# Patient Record
Sex: Female | Born: 1985 | Race: White | Hispanic: No | Marital: Married | Smoking: Never smoker
Health system: Southern US, Community
[De-identification: ages and names within clinical notes are randomized; demographics above are authoritative.]

## PROBLEM LIST (undated history)

## (undated) DIAGNOSIS — E282 Polycystic ovarian syndrome: Secondary | ICD-10-CM

## (undated) DIAGNOSIS — O24419 Gestational diabetes mellitus in pregnancy, unspecified control: Secondary | ICD-10-CM

## (undated) DIAGNOSIS — F419 Anxiety disorder, unspecified: Secondary | ICD-10-CM

## (undated) DIAGNOSIS — Z789 Other specified health status: Secondary | ICD-10-CM

## (undated) HISTORY — DX: Anxiety disorder, unspecified: F41.9

## (undated) HISTORY — PX: WISDOM TOOTH EXTRACTION: SHX21

## (undated) HISTORY — DX: Polycystic ovarian syndrome: E28.2

---

## 2017-12-21 ENCOUNTER — Ambulatory Visit: Payer: Self-pay | Admitting: Nurse Practitioner

## 2017-12-21 VITALS — BP 115/80 | HR 118 | Temp 101.9°F | Resp 16 | Wt 131.6 lb

## 2017-12-21 DIAGNOSIS — R6889 Other general symptoms and signs: Secondary | ICD-10-CM

## 2017-12-21 LAB — POCT INFLUENZA A/B
INFLUENZA B, POC: NEGATIVE
Influenza A, POC: NEGATIVE

## 2017-12-21 MED ORDER — OSELTAMIVIR PHOSPHATE 75 MG PO CAPS: 75.0000 mg | ORAL_CAPSULE | Freq: Two times a day (BID) | ORAL | 0 refills | Status: AC

## 2017-12-21 NOTE — Progress Notes (Signed)
Subjective:  Hannah Coffey is a 32 y.o. female who presents for evaluation of influenza like symptoms.  Symptoms include achiness, cough described as nonproductive, fever: fevers up to 101 degrees and sore throat.  Onset of symptoms was today and has been gradually worsening since that time.  Treatment to date:  none.  High risk factors for influenza complications:  patient states her sister had influenza about 2 weeks ago, patient has not had a flu shot this year..  The following portions of the patient's history were reviewed and updated as appropriate:  allergies, current medications and past medical history.  Constitutional: positive for chills, fatigue, fevers and malaise, negative for anorexia, night sweats and weight loss Eyes: negative Ears, nose, mouth, throat, and face: positive for sore throat, negative for ear drainage, earaches and hoarseness Respiratory: positive for cough Cardiovascular: negative Gastrointestinal: negative Neurological: positive for headaches Behavioral/Psych: negative Allergic/Immunologic: negative Objective:  BP 115/80 (BP Location: Right Arm, Patient Position: Sitting, Cuff Size: Normal)   Pulse (!) 118   Temp (!) 101.9 F (38.8 C) (Oral)   Resp 16   Wt 131 lb 9.6 oz (59.7 kg)   SpO2 99%  General appearance: alert, cooperative, fatigued, flushed and no distress Head: Normocephalic, without obvious abnormality, atraumatic Eyes: conjunctivae/corneas clear. PERRL, EOM's intact. Fundi benign. Ears: normal TM's and external ear canals both ears Nose: clear discharge, turbinates swollen, inflamed, mild maxillary sinus tenderness bilateral, no frontal sinus tenderness bilateral Throat: abnormal findings: mild oropharyngeal erythema and no exudate Lungs: clear to auscultation bilaterally Heart: regular rate and rhythm, S1, S2 normal, no murmur, click, rub or gallop Abdomen: soft, non-tender; bowel sounds normal; no masses,  no organomegaly Pulses: 2+ and  symmetric Skin: Skin color, texture, turgor normal. No rashes or lesions Lymph nodes: cervical lymphadenopathy Neurologic: Grossly normal    Assessment:  influenza like syndrome    Plan:  Antivirals per ordrers. Discussed diagnosis and treatment of influenza. Educational material distributed and questions answered. Suggested symptomatic OTC remedies. Supportive care with appropriate antipyretics and fluids. Follow up as needed.

## 2017-12-21 NOTE — Patient Instructions (Addendum)

## 2019-09-14 LAB — OB RESULTS CONSOLE HEPATITIS B SURFACE ANTIGEN
Hepatitis B Surface Ag: NEGATIVE
Hepatitis B Surface Ag: NEGATIVE

## 2019-09-14 LAB — OB RESULTS CONSOLE RUBELLA ANTIBODY, IGM
Rubella: IMMUNE
Rubella: IMMUNE

## 2019-09-14 LAB — OB RESULTS CONSOLE GC/CHLAMYDIA
Chlamydia: NEGATIVE
Gonorrhea: NEGATIVE

## 2019-09-14 LAB — OB RESULTS CONSOLE ABO/RH: RH Type: POSITIVE

## 2019-09-14 LAB — OB RESULTS CONSOLE ANTIBODY SCREEN: Antibody Screen: NEGATIVE

## 2019-09-14 LAB — OB RESULTS CONSOLE HIV ANTIBODY (ROUTINE TESTING)
HIV: NONREACTIVE
HIV: NONREACTIVE

## 2019-09-14 LAB — OB RESULTS CONSOLE RPR
RPR: NONREACTIVE
RPR: NONREACTIVE

## 2020-01-24 ENCOUNTER — Emergency Department (HOSPITAL_COMMUNITY): Payer: BC Managed Care – PPO

## 2020-01-24 ENCOUNTER — Encounter (HOSPITAL_COMMUNITY): Payer: Self-pay | Admitting: *Deleted

## 2020-01-24 ENCOUNTER — Other Ambulatory Visit: Payer: Self-pay

## 2020-01-24 ENCOUNTER — Observation Stay (HOSPITAL_COMMUNITY)
Admission: EM | Admit: 2020-01-24 | Discharge: 2020-01-25 | Disposition: A | Discharge status: Home / Self Care | Payer: BC Managed Care – PPO | Attending: Obstetrics and Gynecology | Admitting: Obstetrics and Gynecology

## 2020-01-24 DIAGNOSIS — M7989 Other specified soft tissue disorders: Secondary | ICD-10-CM | POA: Insufficient documentation

## 2020-01-24 DIAGNOSIS — S30811A Abrasion of abdominal wall, initial encounter: Secondary | ICD-10-CM | POA: Diagnosis not present

## 2020-01-24 DIAGNOSIS — Z3A31 31 weeks gestation of pregnancy: Secondary | ICD-10-CM

## 2020-01-24 DIAGNOSIS — S8991XA Unspecified injury of right lower leg, initial encounter: Secondary | ICD-10-CM

## 2020-01-24 DIAGNOSIS — Z20822 Contact with and (suspected) exposure to covid-19: Secondary | ICD-10-CM | POA: Insufficient documentation

## 2020-01-24 DIAGNOSIS — Y93K1 Activity, walking an animal: Secondary | ICD-10-CM | POA: Insufficient documentation

## 2020-01-24 DIAGNOSIS — O9A213 Injury, poisoning and certain other consequences of external causes complicating pregnancy, third trimester: Principal | ICD-10-CM

## 2020-01-24 DIAGNOSIS — T148XXA Other injury of unspecified body region, initial encounter: Secondary | ICD-10-CM

## 2020-01-24 DIAGNOSIS — W19XXXA Unspecified fall, initial encounter: Secondary | ICD-10-CM | POA: Diagnosis not present

## 2020-01-24 DIAGNOSIS — S80211A Abrasion, right knee, initial encounter: Secondary | ICD-10-CM | POA: Diagnosis not present

## 2020-01-24 DIAGNOSIS — Z79899 Other long term (current) drug therapy: Secondary | ICD-10-CM | POA: Insufficient documentation

## 2020-01-24 DIAGNOSIS — W101XXA Fall (on)(from) sidewalk curb, initial encounter: Secondary | ICD-10-CM | POA: Insufficient documentation

## 2020-01-24 HISTORY — DX: Other specified health status: Z78.9

## 2020-01-24 LAB — TYPE AND SCREEN
ABO/RH(D): O POS
Antibody Screen: NEGATIVE

## 2020-01-24 LAB — CBC
HCT: 36.4 % (ref 36.0–46.0)
Hemoglobin: 12.1 g/dL (ref 12.0–15.0)
MCH: 31.3 pg (ref 26.0–34.0)
MCHC: 33.2 g/dL (ref 30.0–36.0)
MCV: 94.3 fL (ref 80.0–100.0)
Platelets: 171 10*3/uL (ref 150–400)
RBC: 3.86 MIL/uL — ABNORMAL LOW (ref 3.87–5.11)
RDW: 13.1 % (ref 11.5–15.5)
WBC: 12.6 10*3/uL — ABNORMAL HIGH (ref 4.0–10.5)
nRBC: 0 % (ref 0.0–0.2)

## 2020-01-24 LAB — ABO/RH: ABO/RH(D): O POS

## 2020-01-24 MED ORDER — ZOLPIDEM TARTRATE 5 MG PO TABS: 5.0000 mg | ORAL_TABLET | Freq: Every evening | ORAL | Status: DC | PRN

## 2020-01-24 MED ORDER — ACETAMINOPHEN 325 MG PO TABS
650.0000 mg | ORAL_TABLET | ORAL | Status: DC | PRN
  Administered 2020-01-24 – 2020-01-25 (×3): 650 mg via ORAL
  Filled 2020-01-24 (×3): qty 2

## 2020-01-24 MED ORDER — SODIUM CHLORIDE 0.9% FLUSH
3.0000 mL | Freq: Two times a day (BID) | INTRAVENOUS | Status: DC
  Administered 2020-01-25: 3 mL via INTRAVENOUS

## 2020-01-24 MED ORDER — SODIUM CHLORIDE 0.9 % IV SOLN: 250.0000 mL | INTRAVENOUS | Status: DC | PRN

## 2020-01-24 MED ORDER — DOCUSATE SODIUM 100 MG PO CAPS
100.0000 mg | ORAL_CAPSULE | Freq: Every day | ORAL | Status: DC
  Administered 2020-01-25: 100 mg via ORAL
  Filled 2020-01-24: qty 1

## 2020-01-24 MED ORDER — SODIUM CHLORIDE 0.9% FLUSH: 3.0000 mL | INTRAVENOUS | Status: DC | PRN

## 2020-01-24 MED ORDER — CALCIUM CARBONATE ANTACID 500 MG PO CHEW: 2.0000 | CHEWABLE_TABLET | ORAL | Status: DC | PRN

## 2020-01-24 MED ORDER — ACETAMINOPHEN 325 MG PO TABS: 650.0000 mg | ORAL_TABLET | ORAL | Status: DC | PRN

## 2020-01-24 MED ORDER — PRENATAL MULTIVITAMIN CH
1.0000 | ORAL_TABLET | Freq: Every day | ORAL | Status: DC
  Administered 2020-01-25: 1 via ORAL
  Filled 2020-01-24: qty 1

## 2020-01-24 NOTE — ED Notes (Signed)
Paged [facility]  

## 2020-01-24 NOTE — ED Provider Notes (Signed)
[facility] EMERGENCY DEPARTMENT Provider Note   CSN: [id number] Arrival date & time: 01/24/20  1818     History Chief Complaint  Patient presents with  . Fall    [redacted] weeks pregnant    Hannah Coffey is a 34 y.o. female who is [redacted] weeks pregnant.  Patient reports that approximately 1.5 hours prior to arrival she was walking her dog outside when the dog pulled her, she fell forward onto her right knee before landing on her belly.  She reports that she has some moderate right knee pain throbbing overlying the patella, nonradiating worsened with movement and palpation improved with rest.  She noticed small abrasions to her right knee and she stopped bleeding with direct pressure.  She reports that she did fall onto her abdomen after initially falling onto her right knee, she denies any abdominal pain but did notice some small abrasions to her abdomen.  She denies head injury, loss of consciousness, blood thinner use, headache, vision changes, nausea/vomiting, neck pain, chest pain, abdominal pain, back pain, pain to the upper extremities, pain to the left lower extremity, pelvic pain, contractions, vaginal bleeding/fluid loss or any additional concerns today.  HPI     Past Medical History:  Diagnosis Date  . Medical history non-contributory     Patient Active Problem List   Diagnosis Date Noted  . Fall (on)(from) sidewalk curb, initial encounter 01/24/2020    History reviewed. No pertinent surgical history.   OB History    Gravida  1   Para      Term      Preterm      AB      Living        SAB      TAB      Ectopic      Multiple      Live Births              History reviewed. No pertinent family history.  Social History   Tobacco Use  . Smoking status: Never Smoker  . Smokeless tobacco: Never Used  Substance Use Topics  . Alcohol use: Not Currently  . Drug use: Never    Home Medications Prior to Admission medications     Medication Sig Start Date End Date Taking? Authorizing Provider  metFORMIN (GLUCOPHAGE) 1000 MG tablet Take 1,000 mg by mouth 2 (two) times daily with a meal.    [provider]    Allergies    Patient has no known allergies.  Review of Systems   Review of Systems Ten systems are reviewed and are negative for acute change except as noted in the HPI  Physical Exam Updated Vital Signs BP 132/80 (BP Location: Left Arm)   Pulse (!) 103   Temp 98.3 F (36.8 C) (Oral)   Resp 17   Ht 5\' 5"  (1.651 m)   Wt 79.4 kg   SpO2 97%   BMI 29.12 kg/m   Physical Exam Constitutional:      General: She is not in acute distress.    Appearance: Normal appearance. She is well-developed. She is not ill-appearing or diaphoretic.  HENT:     Head: Normocephalic and atraumatic.     Right Ear: External ear normal.     Left Ear: External ear normal.     Nose: Nose normal.     Mouth/Throat:     Mouth: Mucous membranes are moist.     Pharynx: Oropharynx is clear.  Eyes:  General: Vision grossly intact. Gaze aligned appropriately.     Extraocular Movements: Extraocular movements intact.     Conjunctiva/sclera: Conjunctivae normal.     Pupils: Pupils are equal, round, and reactive to light.  Neck:     Trachea: Trachea and phonation normal. No tracheal deviation.  Cardiovascular:     Rate and Rhythm: Normal rate and regular rhythm.     Pulses: Normal pulses.     Heart sounds: Normal heart sounds.  Pulmonary:     Effort: Pulmonary effort is normal. No respiratory distress.  Chest:     Chest wall: No tenderness.  Abdominal:     General: There is no distension.     Palpations: Abdomen is soft.     Tenderness: There is no abdominal tenderness. There is no guarding or rebound.     Comments: Few small abrasions, nontender nonbleeding.  Musculoskeletal:        General: Normal range of motion.     Cervical back: Normal range of motion.     Comments: No midline C/T/L spinal tenderness to  palpation, no paraspinal muscle tenderness, no deformity, crepitus, or step-off noted. No sign of injury to the neck or back. - Major joints of the bilateral upper extremities mobilized with appropriate ROM and strength without pain or deformity. - All major joints of the left lower extremity mobilized with appropriate range of motion and strength without pain or deformity. - Right hip with appropriate range of motion and strength without pain.  Right ankle and foot with appropriate range of motion and strength without pain. - Right knee mildly tender to palpation overlying the patella.  2 small abrasions nonbleeding present.  Patella tendon intact.  No tenderness overlying the tibial plateau or femur.  Range of motion slightly decreased secondary to pain.  Skin:    General: Skin is warm and dry.  Neurological:     Mental Status: She is alert.     GCS: GCS eye subscore is 4. GCS verbal subscore is 5. GCS motor subscore is 6.     Comments: Speech is clear and goal oriented, follows commands Major Cranial nerves without deficit, no facial droop Moves extremities without ataxia, coordination intact  Psychiatric:        Behavior: Behavior normal.     ED Results / Procedures / Treatments   Labs (all labs ordered are listed, but only abnormal results are displayed) Labs Reviewed  CBC - Abnormal; Notable for the following components:      Result Value   WBC 12.6 (*)    RBC 3.86 (*)    All other components within normal limits  SARS CORONAVIRUS 2 (TAT 6-24 HRS)  TYPE AND SCREEN  ABO/RH    EKG None  Radiology DG Knee Complete 4 Views Right  Result Date: 01/24/2020 CLINICAL DATA:  Fall onto knee. EXAM: RIGHT KNEE - COMPLETE 4+ VIEW COMPARISON:  None. FINDINGS: There is prepatellar soft tissue swelling without evidence for an acute displaced fracture or dislocation. There is no significant joint effusion. IMPRESSION: Prepatellar soft tissue swelling without evidence for an acute  displaced fracture or dislocation. Electronically Signed   By: Katherine Mantle M.D.   On: 01/24/2020 19:46    Procedures Procedures (including critical care time)  Medications Ordered in ED Medications  zolpidem (AMBIEN) tablet 5 mg (has no administration in time range)  docusate sodium (COLACE) capsule 100 mg (has no administration in time range)  calcium carbonate (TUMS - dosed in mg elemental calcium) chewable tablet  400 mg of elemental calcium (has no administration in time range)  prenatal multivitamin tablet 1 tablet (has no administration in time range)  sodium chloride flush (NS) 0.9 % injection 3 mL (0 mLs Intravenous Duplicate 7/98/92 1194)  sodium chloride flush (NS) 0.9 % injection 3 mL (has no administration in time range)  0.9 %  sodium chloride infusion (has no administration in time range)  acetaminophen (TYLENOL) tablet 650 mg (650 mg Oral Given 01/24/20 2104)    ED Course  I have reviewed the triage vital signs and the nursing notes.  Pertinent labs & imaging results that were available during my care of the patient were reviewed by me and considered in my medical decision making (see chart for details).  Clinical Course as of Jan 24 1  Wed Jan 24, 2020  1902 RN to page [facility]   [BM]  2027 MAU provider Eloise Levels tdap   [BM]    Clinical Course User Index [BM] Gari Crown   MDM Rules/Calculators/A&P                     34 year old female who is [redacted] weeks pregnant presents today after being pulled forward by her dog landing onto her right knee and then falling onto her abdomen.  Her primary concern today is right knee pain she has 2 small abrasions overlying the patella and moderate tenderness.  She has no bony tenderness to suggest a plateau fracture, patella tendon is intact.  Neurovascular intact distally to injury.  There is no gross ligamentous laxity.  Additionally she has small abrasions to her abdomen, she has no abdominal tenderness,  contractions or vaginal bleeding/fluid loss.  [facility] was paged and x-ray of the right knee obtained.  She denies any head injury, neck pain, back pain, chest pain no abdominal pain or pain to the other 3 extremities.  No further imaging is indicated at this time. - I was informed by [facility] RN Erin that they plan to bring patient over to MAU for continued observation after right knee x-ray results. - DG Right Knee:  IMPRESSION:  Prepatellar soft tissue swelling without evidence for an acute  displaced fracture or dislocation.   I have personally reviewed x-ray and agree with radiologist interpretation.  Her abrasions were thoroughly cleaned today by nursing staff and wound was dressed with antibiotic ointment.  There is no indication for antibiotics at this time.  Patient has been ambulatory on her leg prior to arrival.  Will treat symptomatically with rice therapy.  Patient states understanding of limitations of x-rays today and that further imaging may be needed.  She will be given referral to orthopedist for follow-up.  Patient was then taken over to [facility] for further observation.  I attempted to find patient's most recent Tdap through care everywhere in [place] [facility] chart review.  I was unable to find her most recent tetanus shot.  I then called MAU provider Eloise Levels who will follow up on patient's tetanus status and administer Tdap if indicated.  At this time there does not appear to be any evidence of an acute emergency medical condition and the patient appears stable for transfer to MAU.  Patient's case discussed with Dr. Regenia Skeeter who agrees with plan to discharge with follow-up.   Note: Portions of this report may have been transcribed using voice recognition software. Every effort was made to ensure accuracy; however, inadvertent computerized transcription errors may still be present. Final Clinical Impression(s) /  ED Diagnoses Final diagnoses:  Fall, initial encounter  Injury of  right knee, initial encounter  Abrasion    Rx / DC Orders ED Discharge Orders    None       Elizabeth Palau 01/25/20 0002    Pricilla Loveless, MD 01/25/20 [phone]

## 2020-01-24 NOTE — Progress Notes (Signed)
EDP at bedside discussing results of Xray. Pt. Cleared by EDP to be transported to MAU at this time. Pt. Will be transported by [facility] RN via wheelchair.

## 2020-01-24 NOTE — Progress Notes (Signed)
Pollux.Rosser RN spoke with on call MD Dr. Tenny Craw about patient. If patient cleared by EDP after xray patient to be transported to MAU for 4 hour observation. Pt. Still denying any abdominal pain, LOF, or vaginal bleeding at this time. Category I FHR tracing. EDP updated.

## 2020-01-24 NOTE — Plan of Care (Signed)
  Problem: Education: Goal: Knowledge of General Education information will improve Description: Including pain rating scale, medication(s)/side effects and non-pharmacologic comfort measures Outcome: Completed/Met

## 2020-01-24 NOTE — Discharge Instructions (Addendum)
You have been diagnosed today with Fall, Right Knee Injury, Abrasion.  At this time there does not appear to be the presence of an emergent medical condition, however there is always the potential for conditions to change. Please read and follow the below instructions.  Please return to the Emergency Department immediately for any new or worsening symptoms. Please be sure to follow up with your Primary Care Provider within one week regarding your visit today; please call their office to schedule an appointment even if you are feeling better for a follow-up visit. Please call the orthopedic specialist Dr. Devonne Doughty on your discharge paperwork for follow-up of your right knee pain.  As we discussed unseen fractures along with soft tissue injury may be present.  Use rest ice and elevation to help with pain and swelling.  Call the orthopedist office tomorrow morning to schedule follow-up appointment to see if further x-rays or other images may be needed.  Get help right away if: Your knee swells, and the swelling gets worse. You cannot move your knee. You have very bad knee pain. You have any new/concerning or worsening of symptoms  Please read the additional information packets attached to your discharge summary.  Do not take your medicine if  develop an itchy rash, swelling in your mouth or lips, or difficulty breathing; call 911 and seek immediate emergency medical attention if this occurs.  Note: Portions of this text may have been transcribed using voice recognition software. Every effort was made to ensure accuracy; however, inadvertent computerized transcription errors may still be present.

## 2020-01-24 NOTE — ED Triage Notes (Signed)
Pt states she tripped and fell on her knee, stomach.  She states her stomach is scraped, but she thinks the impact was to her knee, though she's not sure.  No vag bleeding.

## 2020-01-24 NOTE — MAU Provider Note (Signed)
History     CSN: [id number]  Arrival date and time: 01/24/20 1818   First Provider Initiated Contact with Patient 01/24/20 2028      Chief Complaint  Patient presents with  . Fall    [redacted] weeks pregnant   HPI Hannah Coffey is a 34 y.o. G1P0 at [redacted]w[redacted]d who presents to MAU from [facility] for evaluation after she sustained a fall with direct abdominal trauma. Patient states she was walking her [place] around 1800 hours when she fell forward onto her right knee then fell directly onto her abdomen. She endorses multiple abrasions on both knees as well as her upper abdomen. Upon arrival to MAU she denies abdominal pain, vaginal bleeding, leaking of fluid, decreased fetal movement, fever, falls, or recent illness.  She is requesting Tylenol and ice packs for abrasions to her right knee.  OB History    Gravida  1   Para      Term      Preterm      AB      Living        SAB      TAB      Ectopic      Multiple      Live Births              History reviewed. No pertinent past medical history.  History reviewed. No pertinent surgical history.  No family history on file.  Social History   Tobacco Use  . Smoking status: Never Smoker  . Smokeless tobacco: Never Used  Substance Use Topics  . Alcohol use: Not Currently  . Drug use: Never    Allergies: No Known Allergies  Medications Prior to Admission  Medication Sig Dispense Refill Last Dose  . metFORMIN (GLUCOPHAGE) 1000 MG tablet Take 1,000 mg by mouth 2 (two) times daily with a meal.       Review of Systems  Constitutional: Negative for fatigue and fever.  Respiratory: Negative for shortness of breath.   Gastrointestinal: Negative for abdominal pain.  Genitourinary: Negative for vaginal bleeding.  Musculoskeletal: Negative for back pain.  Neurological: Negative for dizziness, syncope and weakness.  All other systems reviewed and are negative.  Physical Exam   Blood pressure 127/72, pulse 97,  temperature 98.4 F (36.9 C), resp. rate 19, height 5\' 5"  (1.651 m), weight 79.4 kg, SpO2 98 %.  Physical Exam  Nursing note and vitals reviewed. Constitutional: She is oriented to person, place, and time. She appears well-developed and well-nourished.  Cardiovascular: Normal rate.  Respiratory: Effort normal and breath sounds normal.  GI: Soft. She exhibits no distension. There is no abdominal tenderness. There is no rebound, no guarding and no CVA tenderness.  Gravid  Neurological: She is alert and oriented to person, place, and time.  Skin: Skin is warm and dry.  Psychiatric: She has a normal mood and affect. Her behavior is normal. Judgment and thought content normal.    MAU Course/MDM  Procedures  --Minimum overnight obs advised for direct abdominal trauma. Confirmed with Dr. , Faculty Attending --Recommendation communicated to Dr. Macon Large at 2037 --Reactive tracing: baseline 135, moderate variability, positive accels, no decels --Toco: Irregular mild contractions, not felt by patient  Patient Vitals for the past 24 hrs:  BP Temp Temp src Pulse Resp SpO2 Height Weight  01/24/20 2024 127/72 98.4 F (36.9 C) -- 97 19 -- -- --  01/24/20 1922 123/79 -- -- -- -- -- -- --  01/24/20 1841 -- -- -- -- -- --  5\' 5"  (1.651 m) 79.4 kg  01/24/20 1822 126/79 98.7 F (37.1 C) Oral (!) 104 18 98 % -- --       Assessment and Plan  --34 y.o. G1P0 at [redacted]w[redacted]d  --Reactive tracing --Transfer to [facility] for overnight observation  Darlina Rumpf, CNM 01/24/2020, 8:55 PM

## 2020-01-24 NOTE — H&P (Signed)
Hannah Coffey is a 34 y.o. female presenting for evaluation after a fall  34 yo G1P0 @ 31+4 presented initially to the ED after a fall while walking her dog. THe patient initially fell onto her knees and then her abdomen. In the ED she had x-rays of her knee and these were cleared. She was placed on the NST in the ED and then transferred to MAU for further monitoring. ED provider note mentions minor scratches on the patient's belly. However, the MAU provider felt the abrasions were more significant than initially described. Given this the decision was made to admit the patient to antenatal for 23 hour obs and continuous monitoring. OB History    Gravida  1   Para      Term      Preterm      AB      Living        SAB      TAB      Ectopic      Multiple      Live Births             History reviewed. No pertinent past medical history. History reviewed. No pertinent surgical history. Family History: family history is not on file. Social History:  reports that she has never smoked. She has never used smokeless tobacco. She reports previous alcohol use. She reports that she does not use drugs.     Maternal Diabetes: No Genetic Screening: Normal Maternal Ultrasounds/Referrals: Other: Bilateral choroid plexus cysts, placental lakes. F/U US at [facility] showed resolution of CPC and no placental anomaly Fetal Ultrasounds or other Referrals:  Referred to Materal Fetal Medicine  Maternal Substance Abuse:  No Significant Maternal Medications:  None Significant Maternal Lab Results:  None Other Comments:  None  Review of Systems History   Blood pressure 127/72, pulse 97, temperature 98.4 F (36.9 C), resp. rate 19, height 5\' 5"  (1.651 m), weight 79.4 kg, SpO2 98 %. Exam Physical Exam  Prenatal labs: ABO, Rh:  O pos Antibody:  Neg Rubella:  Imm RPR:   NR HBsAg:   Neg HIV:   NR GBS:  Not yet collected   Assessment/Plan: 1) Admit 23 hour obs with continuous monitoring 2)  Fetal tracing to date has been reactive, category 1 with ocassional contractions and uterine irritability   01/24/2020, 8:58 PM

## 2020-01-24 NOTE — Progress Notes (Signed)
Pt. Arrives via private vehicle after fall today while walking her dog. Pt. Denies syncope. Pt. States she just tripped and fell on her abdomen and right knee. Multiple abrasions noted to upper abdomen and right knee. Xray ordered for knee injury. Pt. Denies ctx/abdominal pain, LOF, or vaginal bleeding. Pt. States she has felt fetal movement. Pt. Denies any complications with her pregnancy. G1P0 [redacted]w[redacted]d. Usually gets prenatal care with [facility] and usually sees Dr. Henderson Cloud. Next appt. Friday.

## 2020-01-25 LAB — SARS CORONAVIRUS 2 (TAT 6-24 HRS): SARS Coronavirus 2: NEGATIVE

## 2020-01-25 NOTE — Progress Notes (Signed)
Pt stable with just knee pain.   FHTs have been stable and reactive. Now 130s, gSTV, NST R, Cat one. Toco occ irritability, no contractions.  Ok for D/c to home.

## 2020-01-25 NOTE — Progress Notes (Signed)
34 y.o. G1P0 [redacted]w[redacted]d HD#0 admitted for Fall (on)(from) sidewalk curb, initial encounter [W10.1XXA].  Pt currently stable with no c/o.  Good FM.  Vitals:   01/24/20 1841 01/24/20 1922 01/24/20 2024 01/24/20 2125  BP:  123/79 127/72 132/80  Pulse:   97 (!) 103  Resp:   19 17  Temp:   98.4 F (36.9 C) 98.3 F (36.8 C)  TempSrc:    Oral  SpO2:    97%  Weight: 79.4 kg     Height: 5\' 5"  (1.651 m)       Lungs CTA Cor RRR Abd  Soft, gravid, nontender Ex SCDs FHTs  130s, good short term variability, NST R Toco  Some irritability, no contractions.  Results for orders placed or performed during the hospital encounter of 01/24/20 (from the past 24 hour(s))  CBC     Status: Abnormal   Collection Time: 01/24/20  8:43 PM  Result Value Ref Range   WBC 12.6 (H) 4.0 - 10.5 K/uL   RBC 3.86 (L) 3.87 - 5.11 MIL/uL   Hemoglobin 12.1 12.0 - 15.0 g/dL   HCT 01/26/20 35.5 - 73.2 %   MCV 94.3 80.0 - 100.0 fL   MCH 31.3 26.0 - 34.0 pg   MCHC 33.2 30.0 - 36.0 g/dL   RDW 20.2 54.2 - 70.6 %   Platelets 171 150 - 400 K/uL   nRBC 0.0 0.0 - 0.2 %  SARS CORONAVIRUS 2 (TAT 6-24 HRS) Nasopharyngeal Nasopharyngeal Swab     Status: None   Collection Time: 01/24/20  8:46 PM   Specimen: Nasopharyngeal Swab  Result Value Ref Range   SARS Coronavirus 2 NEGATIVE NEGATIVE  Type and screen [facility]     Status: None   Collection Time: 01/24/20  9:00 PM  Result Value Ref Range   ABO/RH(D) O POS    Antibody Screen NEG    Sample Expiration      01/27/2020,2359 Performed at [facility], [address], [place], [place]   ABO/Rh     Status: None   Collection Time: 01/24/20  9:00 PM  Result Value Ref Range   ABO/RH(D)      O POS Performed at [facility], [address] [place], [place]     A:  HD#0  [redacted]w[redacted]d with fall on abdomen at about 1700 last night.  P: Pt for 24 hours obs post fall.  No bleeding or LOF.  NST is R.  O pos.  [redacted]w[redacted]d

## 2020-01-25 NOTE — Progress Notes (Signed)
Out in wheelchair with husband

## 2020-03-15 ENCOUNTER — Encounter (HOSPITAL_COMMUNITY): Payer: Self-pay | Admitting: *Deleted

## 2020-03-15 ENCOUNTER — Telehealth (HOSPITAL_COMMUNITY): Payer: Self-pay | Admitting: *Deleted

## 2020-03-15 ENCOUNTER — Other Ambulatory Visit: Payer: Self-pay | Admitting: Obstetrics and Gynecology

## 2020-03-15 NOTE — Telephone Encounter (Signed)
Preadmission screen  

## 2020-03-20 ENCOUNTER — Other Ambulatory Visit (HOSPITAL_COMMUNITY)
Admission: RE | Admit: 2020-03-20 | Discharge: 2020-03-20 | Disposition: A | Discharge status: Home / Self Care | Payer: BC Managed Care – PPO | Source: Ambulatory Visit | Attending: Obstetrics and Gynecology | Admitting: Obstetrics and Gynecology

## 2020-03-20 DIAGNOSIS — Z3A39 39 weeks gestation of pregnancy: Secondary | ICD-10-CM | POA: Diagnosis not present

## 2020-03-20 DIAGNOSIS — D62 Acute posthemorrhagic anemia: Secondary | ICD-10-CM | POA: Diagnosis not present

## 2020-03-20 DIAGNOSIS — E669 Obesity, unspecified: Secondary | ICD-10-CM | POA: Diagnosis not present

## 2020-03-20 DIAGNOSIS — O26893 Other specified pregnancy related conditions, third trimester: Secondary | ICD-10-CM | POA: Diagnosis present

## 2020-03-20 DIAGNOSIS — O99824 Streptococcus B carrier state complicating childbirth: Principal | ICD-10-CM | POA: Diagnosis present

## 2020-03-20 DIAGNOSIS — O99214 Obesity complicating childbirth: Secondary | ICD-10-CM | POA: Diagnosis not present

## 2020-03-20 DIAGNOSIS — Z01812 Encounter for preprocedural laboratory examination: Secondary | ICD-10-CM | POA: Insufficient documentation

## 2020-03-20 DIAGNOSIS — Z20822 Contact with and (suspected) exposure to covid-19: Secondary | ICD-10-CM | POA: Insufficient documentation

## 2020-03-20 DIAGNOSIS — O9081 Anemia of the puerperium: Secondary | ICD-10-CM | POA: Diagnosis not present

## 2020-03-20 LAB — SARS CORONAVIRUS 2 (TAT 6-24 HRS): SARS Coronavirus 2: NEGATIVE

## 2020-03-22 ENCOUNTER — Encounter (HOSPITAL_COMMUNITY): Payer: Self-pay | Admitting: Obstetrics and Gynecology

## 2020-03-22 ENCOUNTER — Other Ambulatory Visit: Payer: Self-pay

## 2020-03-22 ENCOUNTER — Inpatient Hospital Stay (HOSPITAL_COMMUNITY)
Admission: AD | Admit: 2020-03-22 | Discharge: 2020-03-26 | DRG: 787 | Disposition: A | Discharge status: Home / Self Care | Payer: BC Managed Care – PPO | Attending: Obstetrics and Gynecology | Admitting: Obstetrics and Gynecology

## 2020-03-22 ENCOUNTER — Inpatient Hospital Stay (HOSPITAL_COMMUNITY): Payer: BC Managed Care – PPO | Admitting: Anesthesiology

## 2020-03-22 ENCOUNTER — Inpatient Hospital Stay (HOSPITAL_COMMUNITY): Payer: BC Managed Care – PPO

## 2020-03-22 DIAGNOSIS — Z20822 Contact with and (suspected) exposure to covid-19: Secondary | ICD-10-CM | POA: Diagnosis present

## 2020-03-22 DIAGNOSIS — O26893 Other specified pregnancy related conditions, third trimester: Secondary | ICD-10-CM | POA: Diagnosis present

## 2020-03-22 DIAGNOSIS — O99214 Obesity complicating childbirth: Secondary | ICD-10-CM | POA: Diagnosis present

## 2020-03-22 DIAGNOSIS — Z3A39 39 weeks gestation of pregnancy: Secondary | ICD-10-CM | POA: Diagnosis not present

## 2020-03-22 DIAGNOSIS — O9081 Anemia of the puerperium: Secondary | ICD-10-CM | POA: Diagnosis not present

## 2020-03-22 DIAGNOSIS — D62 Acute posthemorrhagic anemia: Secondary | ICD-10-CM | POA: Diagnosis not present

## 2020-03-22 DIAGNOSIS — O09529 Supervision of elderly multigravida, unspecified trimester: Secondary | ICD-10-CM

## 2020-03-22 DIAGNOSIS — O99824 Streptococcus B carrier state complicating childbirth: Secondary | ICD-10-CM | POA: Diagnosis present

## 2020-03-22 DIAGNOSIS — E669 Obesity, unspecified: Secondary | ICD-10-CM | POA: Diagnosis present

## 2020-03-22 LAB — CBC
HCT: 37.6 % (ref 36.0–46.0)
Hemoglobin: 12.3 g/dL (ref 12.0–15.0)
MCH: 30.1 pg (ref 26.0–34.0)
MCHC: 32.7 g/dL (ref 30.0–36.0)
MCV: 92.2 fL (ref 80.0–100.0)
Platelets: 180 10*3/uL (ref 150–400)
RBC: 4.08 MIL/uL (ref 3.87–5.11)
RDW: 14.5 % (ref 11.5–15.5)
WBC: 11.4 10*3/uL — ABNORMAL HIGH (ref 4.0–10.5)
nRBC: 0 % (ref 0.0–0.2)

## 2020-03-22 LAB — TYPE AND SCREEN
ABO/RH(D): O POS
Antibody Screen: NEGATIVE

## 2020-03-22 LAB — RPR: RPR Ser Ql: NONREACTIVE

## 2020-03-22 LAB — OB RESULTS CONSOLE GBS
GBS: NEGATIVE
GBS: POSITIVE

## 2020-03-22 MED ORDER — EPHEDRINE 5 MG/ML INJ: 10.0000 mg | INTRAVENOUS | Status: DC | PRN

## 2020-03-22 MED ORDER — OXYTOCIN BOLUS FROM INFUSION: 500.0000 mL | Freq: Once | INTRAVENOUS | Status: DC

## 2020-03-22 MED ORDER — LIDOCAINE HCL (PF) 1 % IJ SOLN
INTRAMUSCULAR | Status: DC | PRN
  Administered 2020-03-22: 10 mL via EPIDURAL

## 2020-03-22 MED ORDER — PENICILLIN G POT IN DEXTROSE 60000 UNIT/ML IV SOLN
3.0000 10*6.[IU] | INTRAVENOUS | Status: DC
  Administered 2020-03-22 – 2020-03-23 (×8): 3 10*6.[IU] via INTRAVENOUS
  Filled 2020-03-22 (×8): qty 50

## 2020-03-22 MED ORDER — LACTATED RINGERS IV SOLN: 500.0000 mL | Freq: Once | INTRAVENOUS | Status: DC

## 2020-03-22 MED ORDER — MISOPROSTOL 50MCG HALF TABLET
50.0000 ug | ORAL_TABLET | Freq: Once | ORAL | Status: AC
  Administered 2020-03-22: 50 ug via BUCCAL
  Filled 2020-03-22: qty 1

## 2020-03-22 MED ORDER — LACTATED RINGERS IV SOLN: INTRAVENOUS | Status: DC

## 2020-03-22 MED ORDER — SODIUM CHLORIDE (PF) 0.9 % IJ SOLN
INTRAMUSCULAR | Status: DC | PRN
  Administered 2020-03-22: 12 mL/h via EPIDURAL

## 2020-03-22 MED ORDER — DIPHENHYDRAMINE HCL 50 MG/ML IJ SOLN: 12.5000 mg | INTRAMUSCULAR | Status: DC | PRN

## 2020-03-22 MED ORDER — MISOPROSTOL 50MCG HALF TABLET
ORAL_TABLET | ORAL | Status: AC
  Administered 2020-03-22: 50 ug
  Filled 2020-03-22: qty 1

## 2020-03-22 MED ORDER — PHENYLEPHRINE 40 MCG/ML (10ML) SYRINGE FOR IV PUSH (FOR BLOOD PRESSURE SUPPORT)
80.0000 ug | PREFILLED_SYRINGE | INTRAVENOUS | Status: DC | PRN
  Filled 2020-03-22: qty 10

## 2020-03-22 MED ORDER — FENTANYL-BUPIVACAINE-NACL 0.5-0.125-0.9 MG/250ML-% EP SOLN
12.0000 mL/h | EPIDURAL | Status: DC | PRN
  Filled 2020-03-22: qty 250

## 2020-03-22 MED ORDER — ACETAMINOPHEN 325 MG PO TABS
650.0000 mg | ORAL_TABLET | ORAL | Status: DC | PRN
  Administered 2020-03-22 (×2): 650 mg via ORAL
  Filled 2020-03-22 (×2): qty 2

## 2020-03-22 MED ORDER — SODIUM CHLORIDE 0.9 % IV SOLN
5.0000 10*6.[IU] | Freq: Once | INTRAVENOUS | Status: AC
  Administered 2020-03-22: 5 10*6.[IU] via INTRAVENOUS
  Filled 2020-03-22: qty 5

## 2020-03-22 MED ORDER — OXYTOCIN 40 UNITS IN NORMAL SALINE INFUSION - SIMPLE MED
2.5000 [IU]/h | INTRAVENOUS | Status: DC
  Filled 2020-03-22: qty 1000

## 2020-03-22 MED ORDER — PHENYLEPHRINE 40 MCG/ML (10ML) SYRINGE FOR IV PUSH (FOR BLOOD PRESSURE SUPPORT): 80.0000 ug | PREFILLED_SYRINGE | INTRAVENOUS | Status: DC | PRN

## 2020-03-22 MED ORDER — FENTANYL CITRATE (PF) 100 MCG/2ML IJ SOLN
50.0000 ug | INTRAMUSCULAR | Status: DC | PRN
  Administered 2020-03-22: 11:00:00 100 ug via INTRAVENOUS
  Filled 2020-03-22: qty 2

## 2020-03-22 MED ORDER — ONDANSETRON HCL 4 MG/2ML IJ SOLN: 4.0000 mg | Freq: Four times a day (QID) | INTRAMUSCULAR | Status: DC | PRN

## 2020-03-22 MED ORDER — LIDOCAINE HCL (PF) 1 % IJ SOLN: 30.0000 mL | INTRAMUSCULAR | Status: DC | PRN

## 2020-03-22 MED ORDER — TERBUTALINE SULFATE 1 MG/ML IJ SOLN
0.2500 mg | Freq: Once | INTRAMUSCULAR | Status: AC | PRN
  Administered 2020-03-22: 0.25 mg via SUBCUTANEOUS
  Filled 2020-03-22: qty 1

## 2020-03-22 MED ORDER — MISOPROSTOL 25 MCG QUARTER TABLET
25.0000 ug | ORAL_TABLET | ORAL | Status: DC | PRN
  Administered 2020-03-22 (×3): 25 ug via VAGINAL
  Filled 2020-03-22 (×4): qty 1

## 2020-03-22 MED ORDER — SOD CITRATE-CITRIC ACID 500-334 MG/5ML PO SOLN
30.0000 mL | ORAL | Status: DC | PRN
  Administered 2020-03-23: 30 mL via ORAL
  Filled 2020-03-22: qty 30

## 2020-03-22 MED ORDER — LACTATED RINGERS IV SOLN: 500.0000 mL | INTRAVENOUS | Status: DC | PRN

## 2020-03-22 NOTE — Anesthesia Preprocedure Evaluation (Signed)

## 2020-03-22 NOTE — H&P (Signed)
34 y.o. [redacted]w[redacted]d  G1P0 comes in schedule IOL for AMA.  Otherwise has good fetal movement and no bleeding.  Past Medical History:  Diagnosis Date  . Medical history non-contributory     Past Surgical History:  Procedure Laterality Date  . WISDOM TOOTH EXTRACTION      OB History  Gravida Para Term Preterm AB Living  1            SAB TAB Ectopic Multiple Live Births               # Outcome Date GA Lbr Len/2nd Weight Sex Delivery Anes PTL Lv  1 Current             Social History   Socioeconomic History  . Marital status: Married    Spouse name: Not on file  . Number of children: Not on file  . Years of education: Not on file  . Highest education level: Not on file  Occupational History  . Not on file  Tobacco Use  . Smoking status: Never Smoker  . Smokeless tobacco: Never Used  Substance and Sexual Activity  . Alcohol use: Not Currently  . Drug use: Never  . Sexual activity: Yes    Birth control/protection: None  Other Topics Concern  . Not on file  Social History Narrative  . Not on file   Social Determinants of Health   Financial Resource Strain:   . Difficulty of Paying Living Expenses:   Food Insecurity:   . Worried About Charity fundraiser in the Last Year:   . Arboriculturist in the Last Year:   Transportation Needs:   . Film/video editor (Medical):   Marland Kitchen Lack of Transportation (Non-Medical):   Physical Activity:   . Days of Exercise per Week:   . Minutes of Exercise per Session:   Stress:   . Feeling of Stress :   Social Connections:   . Frequency of Communication with Friends and Family:   . Frequency of Social Gatherings with Friends and Family:   . Attends Religious Services:   . Active Member of Clubs or Organizations:   . Attends Archivist Meetings:   Marland Kitchen Marital Status:   Intimate Partner Violence:   . Fear of Current or Ex-Partner:   . Emotionally Abused:   Marland Kitchen Physically Abused:   . Sexually Abused:    Patient has no known  allergies.    Prenatal Transfer Tool  Maternal Diabetes: No Genetic Screening: Normal Maternal Ultrasounds/Referrals: Isolated choroid plexus cyst and Other: placental lakes, on f/u scan both resolved Fetal Ultrasounds or other Referrals:  Referred to Materal Fetal Medicine  Maternal Substance Abuse:  No Significant Maternal Medications:  None Significant Maternal Lab Results: Group B Strep positive  Other PNC: AMA, IVF pregnancy    Vitals:   03/22/20 0402 03/22/20 0509 03/22/20 0641 03/22/20 0908  BP: 128/80 113/68 118/66 136/83  Pulse: 79 89 93 89  Resp: 15 16 15    Temp: 98.4 F (36.9 C)     TempSrc: Oral     Weight:      Height:        Lungs/Cor:  NAD Abdomen:  soft, gravid Ex:  no cords, erythema SVE:  0/Th/-3 FHTs:  140, good STV, NST R; Cat 1 tracing. Toco:  irregular   A/P   Admitted term pregnancy IOL for AMA Now s/p 2 doses cytotec with 3rd dose just placed and cervix unchanged Will cont. cytotec for  up to 6 doses, anticipate possible FB, pitocin, AROM when appropriate  GBS Pos- PNC Other routine care  Hannah Coffey

## 2020-03-22 NOTE — Anesthesia Procedure Notes (Signed)
Epidural Patient location during procedure: OB Start time: 03/22/2020 10:34 PM End time: 03/22/2020 10:44 PM  Staffing Anesthesiologist: Lucretia Kern, MD Performed: anesthesiologist   Preanesthetic Checklist Completed: patient identified, IV checked, risks and benefits discussed, monitors and equipment checked, pre-op evaluation and timeout performed  Epidural Patient position: sitting Prep: DuraPrep Patient monitoring: heart rate, continuous pulse ox and blood pressure Approach: midline Location: L3-L4 Injection technique: LOR air  Needle:  Needle type: Tuohy  Needle gauge: 17 G Needle length: 9 cm Needle insertion depth: 6 cm Catheter type: closed end flexible Catheter size: 19 Gauge Catheter at skin depth: 11 cm Test dose: negative  Assessment Events: blood not aspirated, injection not painful, no injection resistance, no paresthesia and negative IV test  Additional Notes Reason for block:procedure for pain

## 2020-03-23 ENCOUNTER — Encounter (HOSPITAL_COMMUNITY): Payer: Self-pay | Admitting: Obstetrics and Gynecology

## 2020-03-23 ENCOUNTER — Encounter (HOSPITAL_COMMUNITY)
Admission: AD | Disposition: A | Discharge status: Home / Self Care | Payer: Self-pay | Source: Home / Self Care | Attending: Obstetrics and Gynecology

## 2020-03-23 LAB — CBC
HCT: 31.5 % — ABNORMAL LOW (ref 36.0–46.0)
Hemoglobin: 10.4 g/dL — ABNORMAL LOW (ref 12.0–15.0)
MCH: 30.7 pg (ref 26.0–34.0)
MCHC: 33 g/dL (ref 30.0–36.0)
MCV: 92.9 fL (ref 80.0–100.0)
Platelets: 157 10*3/uL (ref 150–400)
RBC: 3.39 MIL/uL — ABNORMAL LOW (ref 3.87–5.11)
RDW: 14.7 % (ref 11.5–15.5)
WBC: 19.3 10*3/uL — ABNORMAL HIGH (ref 4.0–10.5)
nRBC: 0 % (ref 0.0–0.2)

## 2020-03-23 SURGERY — Surgical Case
Anesthesia: Epidural | Site: Abdomen | Wound class: Clean Contaminated

## 2020-03-23 MED ORDER — DIPHENHYDRAMINE HCL 25 MG PO CAPS: 25.0000 mg | ORAL_CAPSULE | Freq: Four times a day (QID) | ORAL | Status: DC | PRN

## 2020-03-23 MED ORDER — HYDROMORPHONE HCL 1 MG/ML IJ SOLN
0.2500 mg | INTRAMUSCULAR | Status: DC | PRN
  Administered 2020-03-23: 0.5 mg via INTRAVENOUS

## 2020-03-23 MED ORDER — ONDANSETRON HCL 4 MG/2ML IJ SOLN: 4.0000 mg | Freq: Three times a day (TID) | INTRAMUSCULAR | Status: DC | PRN

## 2020-03-23 MED ORDER — MEPERIDINE HCL 25 MG/ML IJ SOLN: 6.2500 mg | INTRAMUSCULAR | Status: DC | PRN

## 2020-03-23 MED ORDER — TERBUTALINE SULFATE 1 MG/ML IJ SOLN: 0.2500 mg | Freq: Once | INTRAMUSCULAR | Status: DC | PRN

## 2020-03-23 MED ORDER — PRENATAL MULTIVITAMIN CH
1.0000 | ORAL_TABLET | Freq: Every day | ORAL | Status: DC
  Administered 2020-03-24 – 2020-03-25 (×2): 1 via ORAL
  Filled 2020-03-23 (×2): qty 1

## 2020-03-23 MED ORDER — SODIUM CHLORIDE 0.9 % IR SOLN
Status: DC | PRN
  Administered 2020-03-23: 1

## 2020-03-23 MED ORDER — DIPHENHYDRAMINE HCL 25 MG PO CAPS: 25.0000 mg | ORAL_CAPSULE | ORAL | Status: DC | PRN

## 2020-03-23 MED ORDER — SIMETHICONE 80 MG PO CHEW
80.0000 mg | CHEWABLE_TABLET | Freq: Three times a day (TID) | ORAL | Status: DC
  Administered 2020-03-24 – 2020-03-26 (×7): 80 mg via ORAL
  Filled 2020-03-23 (×7): qty 1

## 2020-03-23 MED ORDER — DEXAMETHASONE SODIUM PHOSPHATE 10 MG/ML IJ SOLN
INTRAMUSCULAR | Status: AC
  Filled 2020-03-23: qty 1

## 2020-03-23 MED ORDER — MENTHOL 3 MG MT LOZG: 1.0000 | LOZENGE | OROMUCOSAL | Status: DC | PRN

## 2020-03-23 MED ORDER — KETOROLAC TROMETHAMINE 30 MG/ML IJ SOLN: 30.0000 mg | Freq: Once | INTRAMUSCULAR | Status: DC

## 2020-03-23 MED ORDER — PROMETHAZINE HCL 25 MG/ML IJ SOLN: 6.2500 mg | INTRAMUSCULAR | Status: DC | PRN

## 2020-03-23 MED ORDER — ONDANSETRON HCL 4 MG/2ML IJ SOLN
INTRAMUSCULAR | Status: AC
  Filled 2020-03-23: qty 2

## 2020-03-23 MED ORDER — NALOXONE HCL 0.4 MG/ML IJ SOLN: 0.4000 mg | INTRAMUSCULAR | Status: DC | PRN

## 2020-03-23 MED ORDER — SODIUM CHLORIDE 0.9% FLUSH: 3.0000 mL | INTRAVENOUS | Status: DC | PRN

## 2020-03-23 MED ORDER — DIBUCAINE (PERIANAL) 1 % EX OINT: 1.0000 "application " | TOPICAL_OINTMENT | CUTANEOUS | Status: DC | PRN

## 2020-03-23 MED ORDER — OXYCODONE HCL 5 MG/5ML PO SOLN: 5.0000 mg | Freq: Once | ORAL | Status: DC | PRN

## 2020-03-23 MED ORDER — METRONIDAZOLE IN NACL 5-0.79 MG/ML-% IV SOLN
500.0000 mg | Freq: Once | INTRAVENOUS | Status: DC
  Filled 2020-03-23: qty 100

## 2020-03-23 MED ORDER — SIMETHICONE 80 MG PO CHEW: 80.0000 mg | CHEWABLE_TABLET | ORAL | Status: DC | PRN

## 2020-03-23 MED ORDER — METRONIDAZOLE IN NACL 5-0.79 MG/ML-% IV SOLN
INTRAVENOUS | Status: DC | PRN
  Administered 2020-03-23: 500 mg via INTRAVENOUS

## 2020-03-23 MED ORDER — TETANUS-DIPHTH-ACELL PERTUSSIS 5-2.5-18.5 LF-MCG/0.5 IM SUSP: 0.5000 mL | Freq: Once | INTRAMUSCULAR | Status: DC

## 2020-03-23 MED ORDER — OXYCODONE-ACETAMINOPHEN 5-325 MG PO TABS
1.0000 | ORAL_TABLET | ORAL | Status: DC | PRN
  Administered 2020-03-24 (×2): 1 via ORAL
  Administered 2020-03-25 – 2020-03-26 (×4): 2 via ORAL
  Administered 2020-03-26: 1 via ORAL
  Filled 2020-03-23 (×5): qty 2
  Filled 2020-03-23 (×2): qty 1

## 2020-03-23 MED ORDER — OXYTOCIN 40 UNITS IN NORMAL SALINE INFUSION - SIMPLE MED
INTRAVENOUS | Status: DC | PRN
  Administered 2020-03-23: 40 [IU] via INTRAVENOUS

## 2020-03-23 MED ORDER — NALOXONE HCL 4 MG/10ML IJ SOLN
1.0000 ug/kg/h | INTRAVENOUS | Status: DC | PRN
  Filled 2020-03-23: qty 5

## 2020-03-23 MED ORDER — NALBUPHINE HCL 10 MG/ML IJ SOLN: 5.0000 mg | INTRAMUSCULAR | Status: DC | PRN

## 2020-03-23 MED ORDER — OXYTOCIN 40 UNITS IN NORMAL SALINE INFUSION - SIMPLE MED
INTRAVENOUS | Status: AC
  Filled 2020-03-23: qty 1000

## 2020-03-23 MED ORDER — SODIUM BICARBONATE 8.4 % IV SOLN
INTRAVENOUS | Status: DC | PRN
  Administered 2020-03-23: 10 mL via EPIDURAL
  Administered 2020-03-23: 5 mL via EPIDURAL

## 2020-03-23 MED ORDER — ACETAMINOPHEN 325 MG PO TABS
650.0000 mg | ORAL_TABLET | ORAL | Status: DC | PRN
  Filled 2020-03-23: qty 2

## 2020-03-23 MED ORDER — HYDROMORPHONE HCL 1 MG/ML IJ SOLN
INTRAMUSCULAR | Status: AC
  Filled 2020-03-23: qty 0.5

## 2020-03-23 MED ORDER — OXYTOCIN 40 UNITS IN NORMAL SALINE INFUSION - SIMPLE MED
1.0000 m[IU]/min | INTRAVENOUS | Status: DC
  Administered 2020-03-23: 2 m[IU]/min via INTRAVENOUS
  Administered 2020-03-23: 09:00:00 4 m[IU]/min via INTRAVENOUS

## 2020-03-23 MED ORDER — MORPHINE SULFATE (PF) 10 MG/ML IV SOLN: INTRAVENOUS | Status: DC | PRN

## 2020-03-23 MED ORDER — LACTATED RINGERS IV SOLN: INTRAVENOUS | Status: DC | PRN

## 2020-03-23 MED ORDER — KETOROLAC TROMETHAMINE 30 MG/ML IJ SOLN: 30.0000 mg | Freq: Four times a day (QID) | INTRAMUSCULAR | Status: AC | PRN

## 2020-03-23 MED ORDER — SIMETHICONE 80 MG PO CHEW
80.0000 mg | CHEWABLE_TABLET | ORAL | Status: DC
  Administered 2020-03-24 – 2020-03-25 (×3): 80 mg via ORAL
  Filled 2020-03-23 (×3): qty 1

## 2020-03-23 MED ORDER — WITCH HAZEL-GLYCERIN EX PADS: 1.0000 "application " | MEDICATED_PAD | CUTANEOUS | Status: DC | PRN

## 2020-03-23 MED ORDER — ZOLPIDEM TARTRATE 5 MG PO TABS: 5.0000 mg | ORAL_TABLET | Freq: Every evening | ORAL | Status: DC | PRN

## 2020-03-23 MED ORDER — SCOPOLAMINE 1 MG/3DAYS TD PT72
MEDICATED_PATCH | TRANSDERMAL | Status: DC | PRN
  Administered 2020-03-23: 1 via TRANSDERMAL

## 2020-03-23 MED ORDER — OXYCODONE HCL 5 MG PO TABS: 5.0000 mg | ORAL_TABLET | Freq: Once | ORAL | Status: DC | PRN

## 2020-03-23 MED ORDER — SODIUM CHLORIDE 0.9 % IV SOLN: INTRAVENOUS | Status: DC | PRN

## 2020-03-23 MED ORDER — NALBUPHINE HCL 10 MG/ML IJ SOLN: 5.0000 mg | Freq: Once | INTRAMUSCULAR | Status: DC | PRN

## 2020-03-23 MED ORDER — LIDOCAINE-EPINEPHRINE (PF) 2 %-1:200000 IJ SOLN
INTRAMUSCULAR | Status: AC
  Filled 2020-03-23: qty 20

## 2020-03-23 MED ORDER — MORPHINE SULFATE (PF) 0.5 MG/ML IJ SOLN
INTRAMUSCULAR | Status: AC
  Filled 2020-03-23: qty 10

## 2020-03-23 MED ORDER — LACTATED RINGERS IV SOLN: INTRAVENOUS | Status: DC

## 2020-03-23 MED ORDER — OXYTOCIN 40 UNITS IN NORMAL SALINE INFUSION - SIMPLE MED: 2.5000 [IU]/h | INTRAVENOUS | Status: AC

## 2020-03-23 MED ORDER — ACETAMINOPHEN 500 MG PO TABS
1000.0000 mg | ORAL_TABLET | Freq: Four times a day (QID) | ORAL | Status: AC
  Administered 2020-03-23 – 2020-03-24 (×4): 1000 mg via ORAL
  Filled 2020-03-23 (×4): qty 2

## 2020-03-23 MED ORDER — CEFAZOLIN SODIUM-DEXTROSE 2-4 GM/100ML-% IV SOLN: 2.0000 g | Freq: Once | INTRAVENOUS | Status: DC

## 2020-03-23 MED ORDER — SCOPOLAMINE 1 MG/3DAYS TD PT72: 1.0000 | MEDICATED_PATCH | Freq: Once | TRANSDERMAL | Status: DC

## 2020-03-23 MED ORDER — CEFAZOLIN SODIUM-DEXTROSE 2-3 GM-%(50ML) IV SOLR
INTRAVENOUS | Status: DC | PRN
Start: 2020-03-23 — End: 2020-03-23
  Administered 2020-03-23: 2 g via INTRAVENOUS

## 2020-03-23 MED ORDER — KETOROLAC TROMETHAMINE 30 MG/ML IJ SOLN: 30.0000 mg | Freq: Once | INTRAMUSCULAR | Status: DC | PRN

## 2020-03-23 MED ORDER — ONDANSETRON HCL 4 MG/2ML IJ SOLN
INTRAMUSCULAR | Status: DC | PRN
  Administered 2020-03-23: 4 mg via INTRAVENOUS

## 2020-03-23 MED ORDER — IBUPROFEN 800 MG PO TABS
800.0000 mg | ORAL_TABLET | Freq: Three times a day (TID) | ORAL | Status: DC
  Administered 2020-03-23 – 2020-03-26 (×8): 800 mg via ORAL
  Filled 2020-03-23 (×8): qty 1

## 2020-03-23 MED ORDER — SENNOSIDES-DOCUSATE SODIUM 8.6-50 MG PO TABS
2.0000 | ORAL_TABLET | ORAL | Status: DC
  Administered 2020-03-24 – 2020-03-25 (×3): 2 via ORAL
  Filled 2020-03-23 (×3): qty 2

## 2020-03-23 MED ORDER — DIPHENHYDRAMINE HCL 50 MG/ML IJ SOLN: 12.5000 mg | INTRAMUSCULAR | Status: DC | PRN

## 2020-03-23 MED ORDER — FENTANYL CITRATE (PF) 100 MCG/2ML IJ SOLN
INTRAMUSCULAR | Status: DC | PRN
  Administered 2020-03-23: 100 ug via EPIDURAL

## 2020-03-23 MED ORDER — COCONUT OIL OIL
1.0000 "application " | TOPICAL_OIL | Status: DC | PRN
  Administered 2020-03-23: 1 via TOPICAL

## 2020-03-23 MED ORDER — MORPHINE SULFATE (PF) 10 MG/ML IV SOLN
INTRAVENOUS | Status: DC | PRN
  Administered 2020-03-23: 3 mg via EPIDURAL

## 2020-03-23 MED ORDER — DEXAMETHASONE SODIUM PHOSPHATE 10 MG/ML IJ SOLN
INTRAMUSCULAR | Status: DC | PRN
Start: 2020-03-23 — End: 2020-03-23
  Administered 2020-03-23: 10 mg via INTRAVENOUS

## 2020-03-23 MED ORDER — FENTANYL CITRATE (PF) 100 MCG/2ML IJ SOLN
INTRAMUSCULAR | Status: AC
  Filled 2020-03-23: qty 2

## 2020-03-23 SURGICAL SUPPLY — 34 items
BENZOIN TINCTURE PRP APPL 2/3 (GAUZE/BANDAGES/DRESSINGS) ×3 IMPLANT
CHLORAPREP W/TINT 26ML (MISCELLANEOUS) ×3 IMPLANT
CLAMP CORD UMBIL (MISCELLANEOUS) IMPLANT
CLOSURE STERI STRIP 1/2 X4 (GAUZE/BANDAGES/DRESSINGS) ×3 IMPLANT
CLOTH BEACON ORANGE TIMEOUT ST (SAFETY) ×3 IMPLANT
DRSG OPSITE POSTOP 4X10 (GAUZE/BANDAGES/DRESSINGS) ×3 IMPLANT
ELECT REM PT RETURN 9FT ADLT (ELECTROSURGICAL) ×3
ELECTRODE REM PT RTRN 9FT ADLT (ELECTROSURGICAL) ×1 IMPLANT
EXTRACTOR VACUUM M CUP 4 TUBE (SUCTIONS) IMPLANT
EXTRACTOR VACUUM M CUP 4' TUBE (SUCTIONS)
GLOVE BIOGEL PI IND STRL 6.5 (GLOVE) ×1 IMPLANT
GLOVE BIOGEL PI IND STRL 7.0 (GLOVE) ×1 IMPLANT
GLOVE BIOGEL PI INDICATOR 6.5 (GLOVE) ×2
GLOVE BIOGEL PI INDICATOR 7.0 (GLOVE) ×2
GLOVE ECLIPSE 6.5 STRL STRAW (GLOVE) ×3 IMPLANT
GOWN STRL REUS W/TWL LRG LVL3 (GOWN DISPOSABLE) ×6 IMPLANT
KIT ABG SYR 3ML LUER SLIP (SYRINGE) IMPLANT
NEEDLE HYPO 25X5/8 SAFETYGLIDE (NEEDLE) IMPLANT
NS IRRIG 1000ML POUR BTL (IV SOLUTION) ×3 IMPLANT
PACK C SECTION WH (CUSTOM PROCEDURE TRAY) ×3 IMPLANT
PAD ABD 7.5X8 STRL (GAUZE/BANDAGES/DRESSINGS) IMPLANT
PAD OB MATERNITY 4.3X12.25 (PERSONAL CARE ITEMS) ×3 IMPLANT
PENCIL SMOKE EVAC W/HOLSTER (ELECTROSURGICAL) ×3 IMPLANT
RTRCTR C-SECT PINK 25CM LRG (MISCELLANEOUS) ×3 IMPLANT
SUT MON AB 2-0 CT1 27 (SUTURE) ×3 IMPLANT
SUT MON AB 4-0 PS1 27 (SUTURE) IMPLANT
SUT PDS AB 0 CTX 60 (SUTURE) IMPLANT
SUT PLAIN 2 0 XLH (SUTURE) IMPLANT
SUT VIC AB 0 CTX 36 (SUTURE) ×8
SUT VIC AB 0 CTX36XBRD ANBCTRL (SUTURE) ×4 IMPLANT
SUT VIC AB 4-0 KS 27 (SUTURE) IMPLANT
TOWEL OR 17X24 6PK STRL BLUE (TOWEL DISPOSABLE) ×3 IMPLANT
TRAY FOLEY W/BAG SLVR 14FR LF (SET/KITS/TRAYS/PACK) ×3 IMPLANT
WATER STERILE IRR 1000ML POUR (IV SOLUTION) ×3 IMPLANT

## 2020-03-23 NOTE — Progress Notes (Signed)
Pt comfortable with epidural.  Was able to feel contractions to facilitate pushing, but now not feeling much.  Has been pushing for approx 2hr 40 min on 16 of pitocin, toco q2-4.  Assessed earlier and found to be OP, attempted rotation with success, but then pt slid back to OP.  After almost 3 hours of pushing, still very little descent, and thickened caput forming.  Recurrent late decelerations noted, with several prolonged decels.  Discussed with patient, she stated she was getting tired and with minimal progress and unlikely to affect delivery in the next hour patient agreed with my recommendation to proceed to OR for cesarean.  Head elevation performed prior to bringing patient to OR.  After pitocin was discontinued, FHT improved.  Discussed risks/benefits/complications and consent obtained.  Ancef/Flagyl pre-op abx.

## 2020-03-23 NOTE — Brief Op Note (Signed)
03/23/2020  2:42 PM  PATIENT:  Hannah Coffey  34 y.o. female  PRE-OPERATIVE DIAGNOSIS:  arrest of descent, nonreassuring fetal heart tones, suspect OP presentation  POST-OPERATIVE DIAGNOSIS:  arrest of descent, nonreassuring fetal heart tones, OP  PROCEDURE:  Procedure(s): CESAREAN SECTION (N/A)- low transverse  SURGEON:  Surgeon(s) and Role:    Claiborne Billings, Tykesha Konicki, DO - Primary  ANESTHESIA:   epidural  EBL: see anesthesia report  SPECIMEN:  Source of Specimen:  cord blood  DISPOSITION OF SPECIMEN:  N/A  COUNTS:  YES  PLAN OF CARE: Admit to inpatient   PATIENT DISPOSITION:  PACU - hemodynamically stable.   Delay start of Pharmacological VTE agent (>24hrs) due to surgical blood loss or risk of bleeding: not applicable

## 2020-03-23 NOTE — Lactation Note (Signed)
This note was copied from a baby's chart. Lactation Consultation Note  Patient Name: Girl Galena Logie LEXNT'Z Date: 03/23/2020 Reason for consult: Initial assessment;1st time breastfeeding;Term P1, 8 hour term female infant. Infant had two stools and one void, dad changed a stool while LC was in the room. Tools given: hand pump, DEBP and 24 mm NS mom has semi flat nipples with ring (abraison) around whole areola top and bottom. Infant with questionable upper lip tie ( labial frenulum) and bottom thick tight bands tongue tie ( ankyloglossia). RN gave mom coconut oil to help with breast tenderness and sore nipples.  Per mom, she made two previous attempts to latch infant infant would not sustain latch and has been spitty. Mom pre-pumped breast prior to latch infant on her right breast using the foot ball hold position, infant would not sustain latch or form good ceil around breast , LC asked mom to sandwich breast after few attempts and due to abraison and nipple soreness LC gave mom 24 mm NS. Mom attempted to re-latch infant with 24 mm NS infant sustained latch after few attempts and breastfed for 7 minutes. LC discussed hand expression and infant was given 5 mls of colostrum by spoon. Mom understands to breastfeed infant according to hunger cues, on demand and not exceed 3 hours without breastfeeding infant. Mom know if infant not latching to hand express and give infant back express breast milk on spoon. Mom knows to call RN or LC if she needs assistance with latching infant at breast. Mom will pump every 3 hours for 15 minutes on initial setting due to use 24 mm NS. Reviewed Baby & Me book's Breastfeeding Basics.  Mom made aware of O/P services, breastfeeding support groups, community resources, and our phone # for post-discharge questions.  Mom's 24 hour plan: 1. Mom will pre-pump breast with hand pump and then apply 24 mm NS before latching infant at breast. 2. Mom will apply coconut oil to  breast for nipple soreness. 3. Mom will breastfeed infant according feeding cues. 4. Mom knows if infant doesn't latch to hand express and give back volume 5-7 mls per feeding for first 24 hours. 5.Mom will use DEBP every 3 hours for 15 minutes on initial setting.  Maternal Data Formula Feeding for Exclusion: No Has patient been taught Hand Expression?: Yes Does the patient have breastfeeding experience prior to this delivery?: No  Feeding Feeding Type: Breast Fed  LATCH Score Latch: Repeated attempts needed to sustain latch, nipple held in mouth throughout feeding, stimulation needed to elicit sucking reflex.  Audible Swallowing: A few with stimulation  Type of Nipple: Flat  Comfort (Breast/Nipple): Filling, red/small blisters or bruises, mild/mod discomfort  Hold (Positioning): Assistance needed to correctly position infant at breast and maintain latch.  LATCH Score: 5  Interventions Interventions: Breast feeding basics reviewed;Breast compression;Adjust position;Assisted with latch;Skin to skin;Support pillows;Breast massage;Position options;Hand express;Expressed milk;Pre-pump if needed;Hand pump;DEBP  Lactation Tools Discussed/Used Tools: Pump;Coconut oil;Nipple Shields Nipple shield size: 24 Breast pump type: Double-Electric Breast Pump WIC Program: No Pump Review: Setup, frequency, and cleaning;Milk Storage Initiated by:: Danelle Earthly, IBCLC Date initiated:: 03/23/20   Consult Status Consult Status: Follow-up Date: 03/24/20 Follow-up type: In-patient    Danelle Earthly 03/23/2020, 9:59 PM

## 2020-03-23 NOTE — Transfer of Care (Signed)
Immediate Anesthesia Transfer of Care Note  Patient: Hannah Coffey  Procedure(s) Performed: CESAREAN SECTION (N/A Abdomen)  Patient Location: PACU  Anesthesia Type:Epidural  Level of Consciousness: awake, alert  and oriented  Airway & Oxygen Therapy: Patient Spontanous Breathing  Post-op Assessment: Report given to RN and Post -op Vital signs reviewed and stable  Post vital signs: Reviewed and stable  Last Vitals:  Vitals Value Taken Time  BP 120/89 03/23/20 1403  Temp    Pulse 99 03/23/20 1406  Resp 23 03/23/20 1406  SpO2 100 % 03/23/20 1406  Vitals shown include unvalidated device data.  Last Pain:  Vitals:   03/23/20 1230  TempSrc:   PainSc: 0-No pain         Complications: No apparent anesthesia complications

## 2020-03-24 LAB — CBC
HCT: 30.9 % — ABNORMAL LOW (ref 36.0–46.0)
Hemoglobin: 9.7 g/dL — ABNORMAL LOW (ref 12.0–15.0)
MCH: 29.8 pg (ref 26.0–34.0)
MCHC: 31.4 g/dL (ref 30.0–36.0)
MCV: 94.8 fL (ref 80.0–100.0)
Platelets: 163 10*3/uL (ref 150–400)
RBC: 3.26 MIL/uL — ABNORMAL LOW (ref 3.87–5.11)
RDW: 14.6 % (ref 11.5–15.5)
WBC: 19.9 10*3/uL — ABNORMAL HIGH (ref 4.0–10.5)
nRBC: 0 % (ref 0.0–0.2)

## 2020-03-24 MED ORDER — FERROUS FUMARATE 324 (106 FE) MG PO TABS
1.0000 | ORAL_TABLET | Freq: Every day | ORAL | Status: DC
  Administered 2020-03-24 – 2020-03-26 (×3): 106 mg via ORAL
  Filled 2020-03-24 (×3): qty 1

## 2020-03-24 NOTE — Progress Notes (Signed)
RN went in for regular fundal check and assisted pt to bathroom. This was the first time getting up since pt c-section- one other attempt made around 2030 on 4/24. Pt did not feel up to going to the bathroom at that time due to feeling a little lightheaded. On this attempt she walked with assistance from RN to bathroom and did well. Pt stated she felt more pain at her incision rather than lightheadedness. While cleaning up on the toilet pt passed one clot, approximately the size of a plum. RN weighed on the triton and it was 89 QBL. Pt felt okay during this and walked back to bed fine and is now comfortable. RN rechecked her fundus and it was firm with only a small amount of bleeding. Will recheck in an hour.

## 2020-03-24 NOTE — Lactation Note (Signed)
This note was copied from a baby's chart. Lactation Consultation Note  Patient Name: Hannah Coffey [id number] Date: 03/24/2020  [facility] entered room, RN trying to latch infant and infant upset.  LC attempt to latch infant.  Mom does not really try and help.  Infant contnues to be upset and Parents appear exhausted.  Infant has had latch scores between 5-6, mom using a nipple shield, has been doing some prepumping with manual pump but reports she has only used DEBP one time.  Discussed with parents possibility of using Donor milk.  Mom said yes. LC got 25 ml of Donor Milk and RN got labels.   Gave infant 7 ml of Donor milk in curved tip syringe in nipple shield. On left breast in cradle/cross cradle hold.  Mom does not want to hold her breast.  Tried to switch her from cradle to cross cradle and mom reports that's not comfortable. Mom has a hard time with positioning and latch and does not keep infant in close. Then showed dad how to finger feed and fed infant 3 more ml.  Moms right nipple is very red and abraded.  Mom reports it is very sore. Infant content after 3 more ml donor milk.   Mom knows to feed on cue.  Hand express before applying nipple shield .Hand express and post pump past breastfeeding.  Feed back all expressed mothers milk.  RN gave recommended amount sheet for supplementation with mothers milk and/or Donor milk.  Maternal Data    Feeding Feeding Type: Breast Fed  [facility] Score                   Interventions    Lactation Tools Discussed/Used     Consult Status      Elieser Tetrick Michaelle Copas 03/24/2020, 9:57 PM

## 2020-03-24 NOTE — Progress Notes (Signed)
Patient is eating, ambulating, voiding.  Pain control is good.  Appropriate lochia, no complaints. +flatus  Vitals:   03/23/20 2030 03/24/20 0009 03/24/20 0349 03/24/20 0802  BP:  117/75 101/71 103/71  Pulse:  90 81 74  Resp: 18 18 18 18   Temp: 98 F (36.7 C) 98 F (36.7 C) 97.8 F (36.6 C) 97.8 F (36.6 C)  TempSrc: Oral Oral Oral Oral  SpO2: 99% 96% 96% 95%  Weight:      Height:        Fundus firm Abd: soft, nontender Ext: no calf tenderness  Lab Results  Component Value Date   WBC 19.9 (H) 03/24/2020   HGB 9.7 (L) 03/24/2020   HCT 30.9 (L) 03/24/2020   MCV 94.8 03/24/2020   PLT 163 03/24/2020    --/--/O POS (04/23 0039)  A/P Post op day #1 s/p c/s for arrest of descent/ OP Anemia- mild, Fe qd Baby doing well  Routine care.  Expect d/c 4/26.    5/26

## 2020-03-24 NOTE — Anesthesia Postprocedure Evaluation (Signed)
Anesthesia Post Note  Patient: Hannah Coffey  Procedure(s) Performed: CESAREAN SECTION (N/A Abdomen)     Patient location during evaluation: PACU Anesthesia Type: Epidural Level of consciousness: oriented and awake and alert Pain management: pain level controlled Vital Signs Assessment: post-procedure vital signs reviewed and stable Respiratory status: spontaneous breathing, respiratory function stable and patient connected to nasal cannula oxygen Cardiovascular status: blood pressure returned to baseline and stable Postop Assessment: no headache, no backache, no apparent nausea or vomiting and epidural receding Anesthetic complications: no    Last Vitals:  Vitals:   03/24/20 0009 03/24/20 0349  BP: 117/75 101/71  Pulse: 90 81  Resp: 18 18  Temp: 36.7 C 36.6 C  SpO2: 96% 96%    Last Pain:  Vitals:   03/24/20 0605  TempSrc:   PainSc: 0-No pain   Pain Goal: Patients Stated Pain Goal: 3 (03/23/20 1809)                 Lannie Fields

## 2020-03-25 LAB — BIRTH TISSUE RECOVERY COLLECTION (PLACENTA DONATION)

## 2020-03-25 NOTE — Progress Notes (Signed)
Subjective: Postpartum Day 2: Cesarean Delivery Patient reports tolerating PO and no problems voiding.    Objective: Patient Vitals for the past 24 hrs:  BP Temp Temp src Pulse Resp SpO2  03/25/20 0500 111/78 97.9 F (36.6 C) Oral 89 18 98 %  03/25/20 0000 119/75 98.1 F (36.7 C) Oral 97 18 98 %  03/24/20 1439 104/74 98 F (36.7 C) Oral 79 18 --    Physical Exam:  General: alert Lochia: appropriate Uterine Fundus: firm Incision: dressing in place, dry DVT Evaluation: No evidence of DVT seen on physical exam.  Recent Labs    03/23/20 1529 03/24/20 0551  HGB 10.4* 9.7*  HCT 31.5* 30.9*    Assessment/Plan: Status post Cesarean section. Doing well postoperatively. Working on breastfeeding, will re-eval discharge this afternoon pending feeding Continue current care. Hannah Coffey G1P1001 PPD#2 sp CS at [redacted]w[redacted]d for arrest of descent 1. PPC: Doing well, routine postop care 2. Anemia: Hgb 10.4>9.7, EBL 1055cc, discharge with PO iron 3. Obesity BMI 32 Rubella immune, blood type O+, breastfeeding, baby girl  Hannah Coffey Hannah Coffey 03/25/2020, 9:04 AM

## 2020-03-25 NOTE — Lactation Note (Addendum)
This note was copied from a baby's chart. Lactation Consultation Note  Patient Name: Hannah Coffey [id number] Date: 03/25/2020   Supplementing at the L breast was attempted; Mom said her R side was too sore to feed from. Infant was not opening mouth wide enough at that moment. Mom says sometimes infant takes a few minutes to latch & sometimes latches right on. It was decided to give infant more time as Dad felt infant wasn't ready to feed again, yet. Mom also stated that she had imagined something else for supplementing at the breast.  I left room to get more DBM & when I returned I gave Mom the option of pumping & BO for a couple of feedings (we could revisit supplementing at the breast later, if she desired). Mom agreed to using a bottle. I provided a yellow slow-flow Similac nipple & I recommended that Dad offer 15 mL & let infant feed until she's content. I asked that parents let staff know if they had any concerns about the nipple flow rate.   I encouraged Mom to lubricate the inside of her flange with coconut oil to make pumping more comfortable. I recommended that Mom pump whenever infant received any DBM.  Parents know how to reach me if they'd like for me to return. I did not observe Mom pumping, but the size 24 flanges she is using seem appropriate at this time.   Lurline Hare [facility] 03/25/2020, 10:06 AM

## 2020-03-25 NOTE — Lactation Note (Signed)
This note was copied from a baby's chart. Lactation Consultation Note  Patient Name: Hannah Coffey [id number]'[id number] Date: 03/25/2020   Infant at 44 hrs of life & almost 8% weight loss. When I walked into room, Mom was crying. Mom said they were "happy tears," but she also acknowledged that feeding the infant was challenging.   Mom has a compression stripe on her R nipple & the base of her nipple is pink. Mom also has a slight brown area of pigmentation on her L nipple that corresponds to the compression stripe on her R nipple, but Mom says that is her natural pigmentation (Mom has similar pigmentation on her areola). Mom is unsure if there is any change in heaviness in her breasts since birth.   Parents have been using a curved-tip syringe to shoot under the nipple shield when feeding. Mom says that she feels that she needs "5 hands." I asked if we could attempt supplementing with a 5Fr/syringe under the nipple shield. "Leroy Sea" is beginning to cue. Mom agreed. Pediatrician into room. I will return once MD leaves room.   Lurline Hare [facility] 03/25/2020, 9:36 AM

## 2020-03-26 MED ORDER — IBUPROFEN 800 MG PO TABS: 800.0000 mg | ORAL_TABLET | Freq: Three times a day (TID) | ORAL | 0 refills | Status: DC

## 2020-03-26 NOTE — Progress Notes (Signed)
POD#3 Pt without complaints. She would like to go home.  VSSAF IMP/ Stable Plan/Will discharge to home.

## 2020-03-26 NOTE — Discharge Summary (Signed)
Obstetric Discharge Summary Reason for Admission: induction of labor Prenatal Procedures: NST and ultrasound Intrapartum Procedures: cesarean: low cervical, transverse Postpartum Procedures: none Complications-Operative and Postpartum: none Hemoglobin  Date Value Ref Range Status  03/24/2020 9.7 (L) 12.0 - 15.0 g/dL Final   HCT  Date Value Ref Range Status  03/24/2020 30.9 (L) 36.0 - 46.0 % Final    Physical Exam:  General: alert and cooperative Lochia: appropriate  Discharge Diagnoses: Term Pregnancy-delivered and arrest of desent and history of infertility  Discharge Information: Date: 03/26/2020 Activity: pelvic rest Diet: routine Medications: PNV, Ibuprofen and Percocet Condition: stable Instructions: refer to practice specific booklet Discharge to: home Follow-up Information    Philip Aspen, DO. Schedule an appointment as soon as possible for a visit in 1 month(s).   Specialty: Obstetrics and Gynecology Contact information: [address] [place] [address] [phone]           Newborn Data: Live born female  Birth Weight: 7 lb 13.6 oz (3560 g) APGAR: 8, 9  Newborn Delivery   Birth date/time: 03/23/2022 13:02:00 Delivery type: C-Section, Low Transverse Trial of labor: Yes C-section categorization: Primary      Home with mother.  Hannah Coffey 03/26/2020, 11:09 AM

## 2020-03-26 NOTE — Lactation Note (Addendum)
This note was copied from a baby's chart. Lactation Consultation Note  Patient Name: Hannah Coffey [id number] Date: 03/26/2020   Per Wyona Almas, RN: Mom does not want a lactation consult prior to discharge.   Infant has noted to have only lost 5 grams since the previous day. During the time period between those weights, infant had 4 voids & 6 stools.    Lurline Hare [facility] 03/26/2020, 9:15 AM

## 2020-04-02 NOTE — Op Note (Signed)
Hannah Coffey, Hannah Coffey MEDICAL RECORD [id number] ACCOUNT [id number] DATE OF BIRTH:1986-02-21 FACILITY: [facility] LOCATION: [place], [place]  OPERATIVE REPORT  DATE OF PROCEDURE:  03/23/2020  PREOPERATIVE DIAGNOSES:  Arrest of descent, nonreassuring fetal heart tones, suspected occiput posterior presentation.  POSTOPERATIVE DIAGNOSES:  Arrest of descent, nonreassuring fetal heart tones, suspected occiput posterior presentation, with occiput posterior confirmed.  PROCEDURE:  Low transverse cesarean section.  SURGEON:  Allyn Kenner, DO  ANESTHESIA:  Epidural.  ESTIMATED BLOOD LOSS:  Please see anesthesia report.  SPECIMENS:  Cord blood.  FINDINGS:  Female infant, cephalic OP presentation, Apgars 8 and 9, weight 7 pounds 13 ounces.  Normal tubes and ovaries bilaterally.  DISPOSITION OF PATIENT:  PACU in stable condition.  DESCRIPTION OF PROCEDURE:  The patient was taken to the operating room where epidural anesthesia was found to be adequate.  She was prepped and draped in the normal sterile fashion in dorsal supine position with a leftward tilt.  Scalpel was used to make  a Pfannenstiel skin incision which was carried down to underlying layer of fascia with Bovie cautery.  The fascia was incised with the scalpel and extended laterally with Mayo scissors.  Kocher clamps were placed at the superior aspect of the fascial  incision.  Rectus muscles were dissected off bluntly and sharply.  Kocher clamps were then placed inferior aspect of the fascial incision.  Rectus muscles were dissected off bluntly and sharply.  Hemostats were used to separate rectus muscle superiorly  with good visualization of the peritoneum.  It was entered sharply and extended by lateral traction.  Abdomen and pelvis were manually surveyed.  No abnormalities noted and the [employer] was placed.  Vesicouterine peritoneum was identified,  tented and incised sharply with Metzenbaum  scissors and extended laterally.  Bladder flap was developed digitally.  A low transverse cesarean incision was made with a scalpel and cephalic and caudal traction was performed to extend the hysterotomy.  The  infant's head was located deep in the pelvis, elevated with minimal difficulty, and delivered with head in flexed position followed by the remainder of the infant's body.  Infant was bulb suctioned and dried while delayed cord clamping was performed.   Cord was then clamped, cut, and infant was handed off to awaiting neonatology.  Cord blood was collected and external massage of the uterus was performed with gentle traction on the umbilical cord to facilitate delivery of placenta.  The uterus was  cleared of all clot and debris and the uterine incision was reapproximated and closed with Vicryl in a running locked fashion followed by 2nd layer of vertical imbrication.  Anesthesia did note that there was blood in the tubing.  No additional urine  present.  Nursing staff was asked to flush the Foley catheter.  I did not have a high level of concern that any damage had been performed to the bladder as it was very well out of the way during delivery; however, I did believe that minimal trauma due to  the head being low in the pelvis in the delivery had caused temporary blood.  This was noted; however, and monitored postoperatively.  The tubes and ovaries were found to be normal.  The peritoneum was reapproximated and closed with Monocryl in a  running fashion followed by 3 continuous sutures reapproximating the lower aspect of the rectus muscles.  The fascia was then reapproximated and closed with Vicryl in a running fashion.  Subcutaneous tissue was irrigated, dried and minimal use of  Bovie  cautery was needed for hemostasis.  Minimal subcutaneous tissue did not require reapproximation.  Skin was reapproximated and closed with Vicryl subcuticularly.  The patient tolerated the procedure well.  Sponge, lap  and needle counts were correct x2.   The patient was taken to recovery in stable condition.  CN/NUANCE  D:04/02/2020 T:04/02/2020 JOB:[id number]

## 2022-01-26 IMAGING — CR DG KNEE COMPLETE 4+V*R*
4 series · 4 of 4 positions shown · non-contrast
Comparison: None.

CLINICAL DATA: Fall onto knee.

EXAM:
RIGHT KNEE - COMPLETE 4+ VIEW

[knee ap]
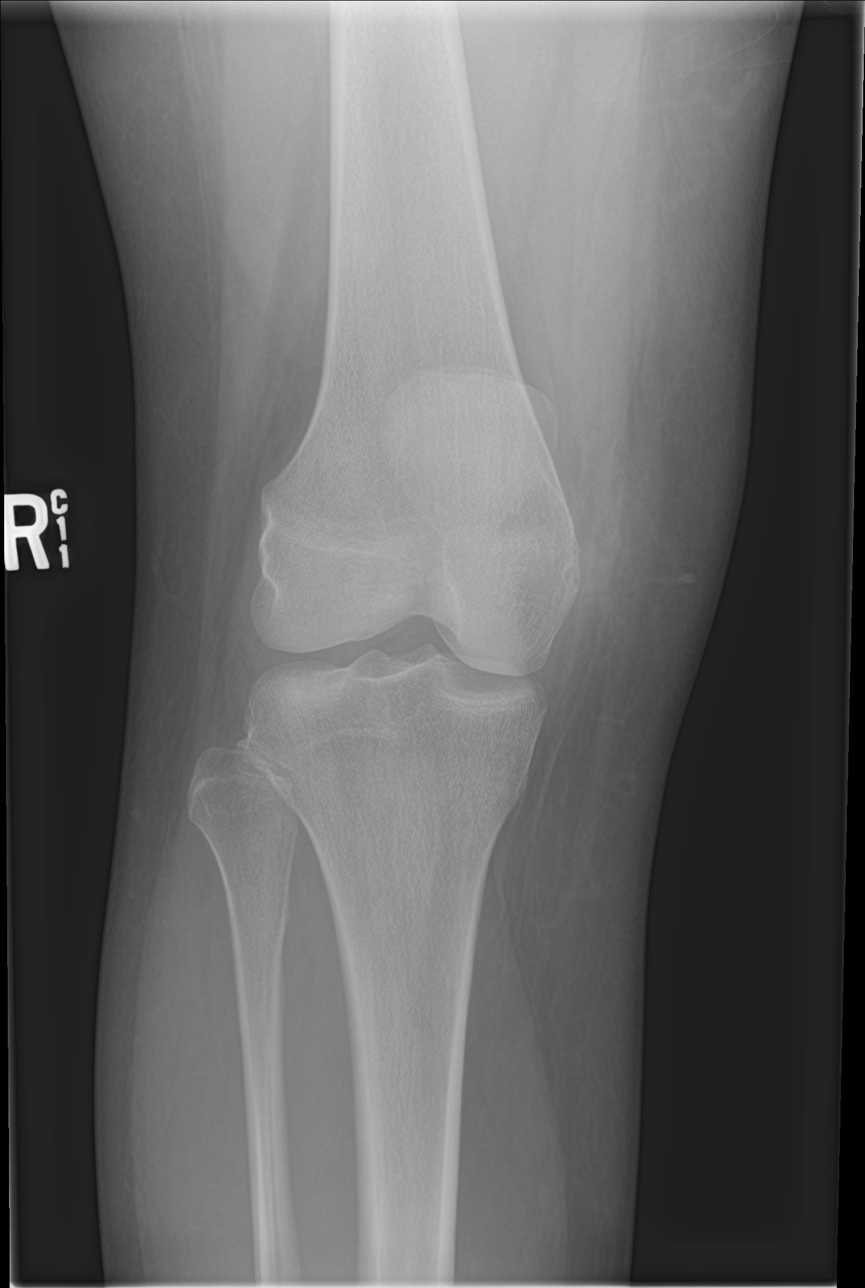

[knee lat]
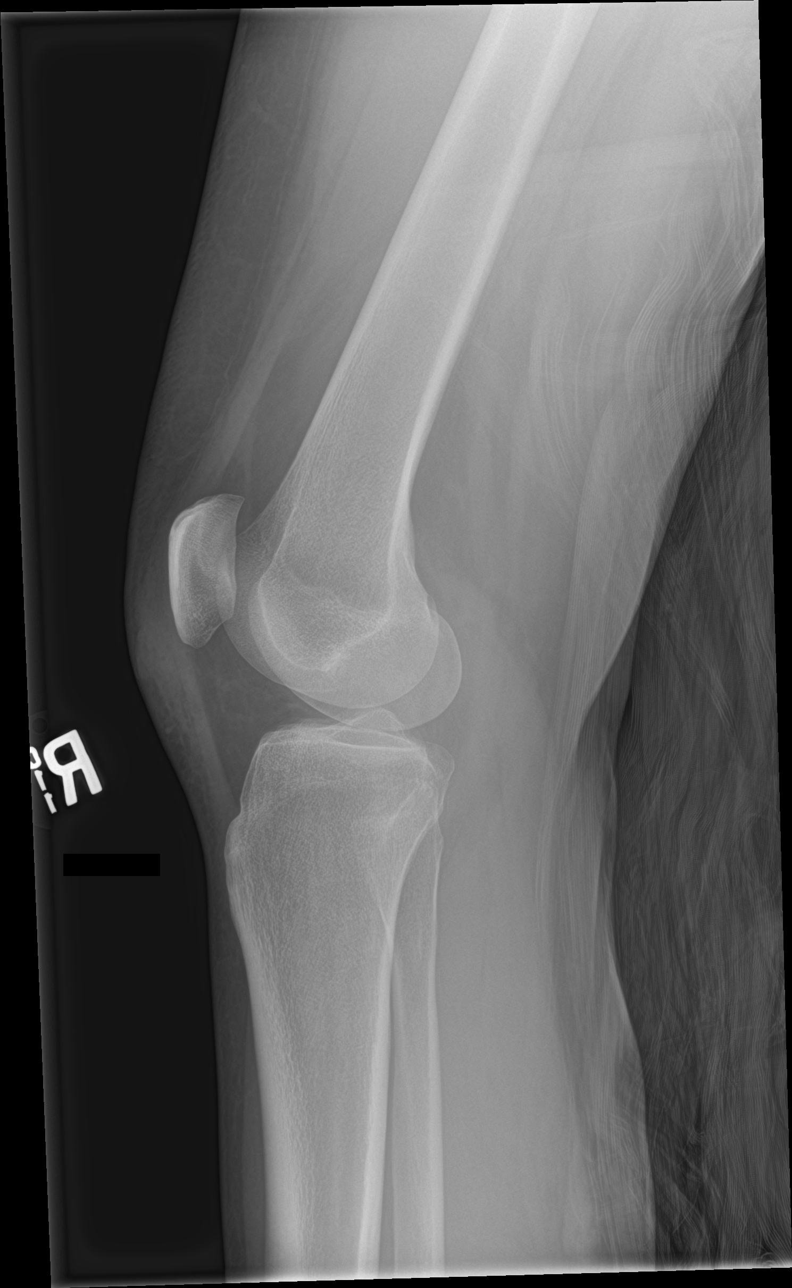

[knee obl (1 of 2)]
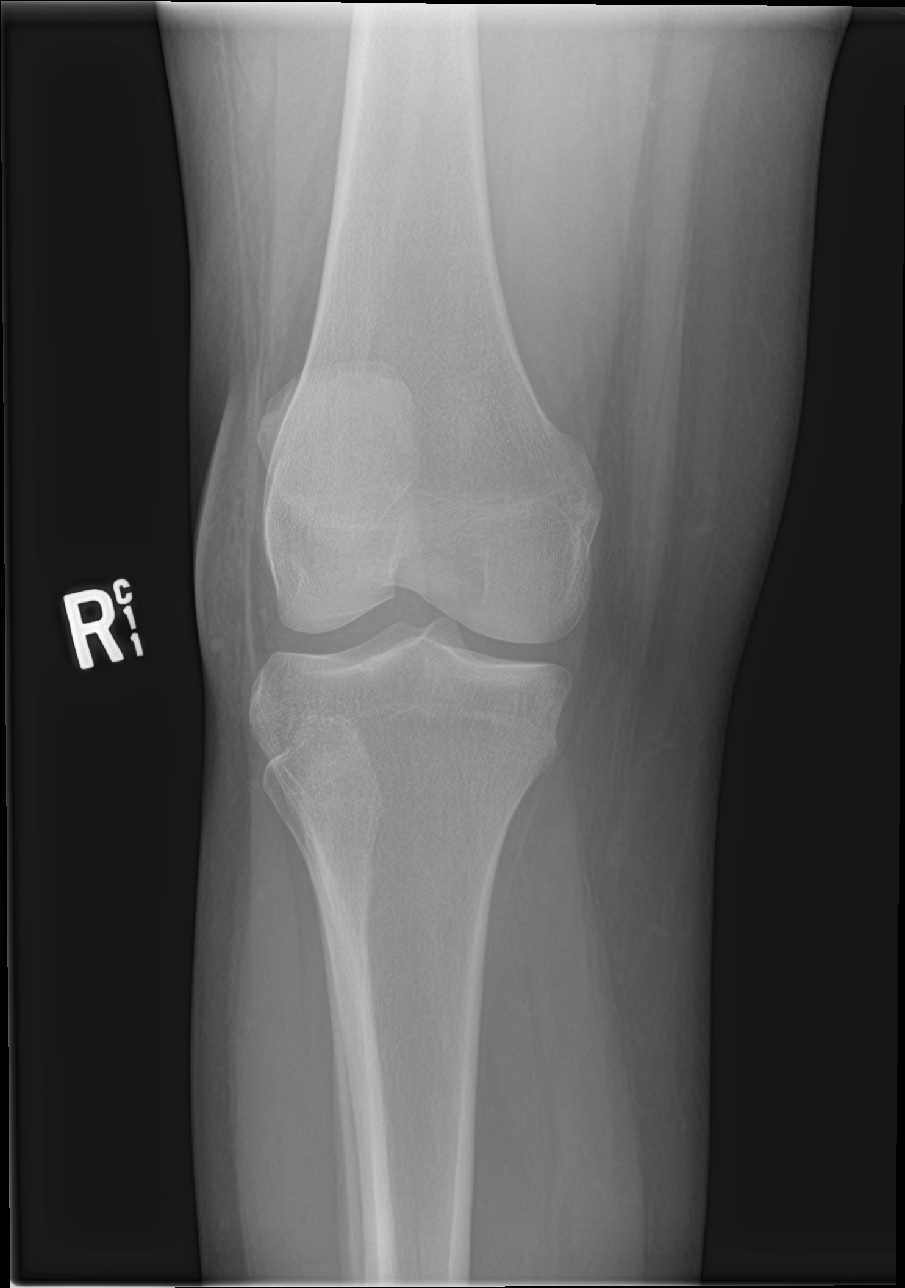

[knee obl (2 of 2)]
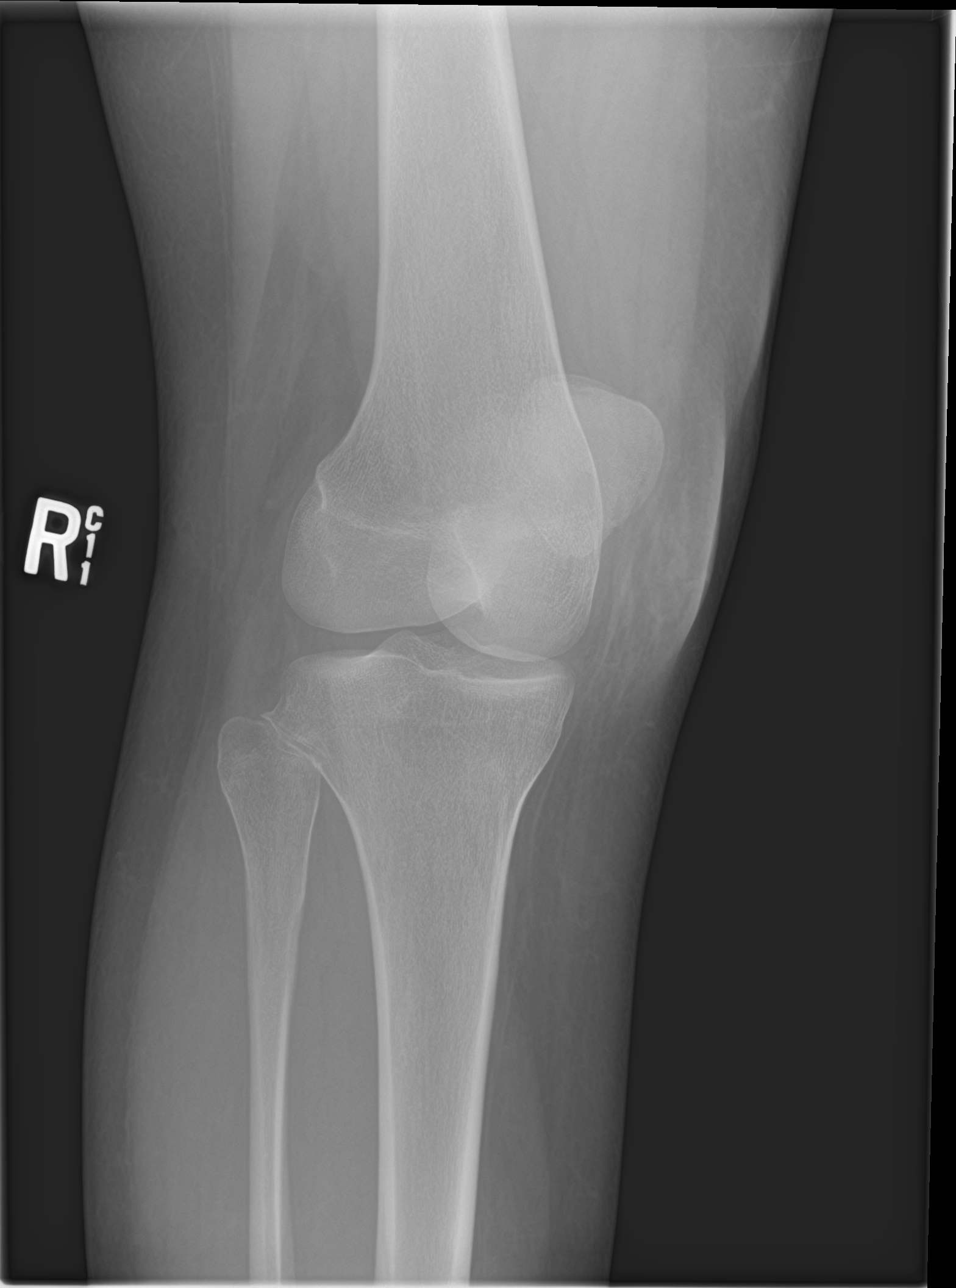

[4 of 4 positions shown; findings below may reference images not displayed]

FINDINGS: There is prepatellar soft tissue swelling without evidence for an
acute displaced fracture or dislocation. There is no significant
joint effusion.
IMPRESSION: Prepatellar soft tissue swelling without evidence for an acute
displaced fracture or dislocation.

## 2023-07-26 LAB — HEPATITIS C ANTIBODY: HCV Ab: NEGATIVE

## 2023-07-26 LAB — OB RESULTS CONSOLE ANTIBODY SCREEN: Antibody Screen: NEGATIVE

## 2023-07-26 LAB — OB RESULTS CONSOLE RUBELLA ANTIBODY, IGM: Rubella: IMMUNE

## 2023-07-26 LAB — OB RESULTS CONSOLE GC/CHLAMYDIA
Chlamydia: NEGATIVE
Neisseria Gonorrhea: NEGATIVE

## 2023-07-26 LAB — OB RESULTS CONSOLE HIV ANTIBODY (ROUTINE TESTING): HIV: NONREACTIVE

## 2023-07-26 LAB — OB RESULTS CONSOLE HEPATITIS B SURFACE ANTIGEN: Hepatitis B Surface Ag: NEGATIVE

## 2023-07-26 LAB — OB RESULTS CONSOLE RPR
RPR: NONREACTIVE
RPR: NONREACTIVE

## 2023-09-17 ENCOUNTER — Other Ambulatory Visit: Payer: Self-pay | Admitting: Obstetrics and Gynecology

## 2023-09-17 DIAGNOSIS — O283 Abnormal ultrasonic finding on antenatal screening of mother: Secondary | ICD-10-CM

## 2023-09-17 DIAGNOSIS — Z363 Encounter for antenatal screening for malformations: Secondary | ICD-10-CM

## 2023-09-27 ENCOUNTER — Other Ambulatory Visit: Payer: Self-pay

## 2023-09-30 ENCOUNTER — Encounter: Payer: Self-pay | Admitting: *Deleted

## 2023-09-30 DIAGNOSIS — O09529 Supervision of elderly multigravida, unspecified trimester: Secondary | ICD-10-CM | POA: Insufficient documentation

## 2023-09-30 DIAGNOSIS — O365920 Maternal care for other known or suspected poor fetal growth, second trimester, not applicable or unspecified: Secondary | ICD-10-CM | POA: Insufficient documentation

## 2023-10-13 ENCOUNTER — Ambulatory Visit: Payer: BC Managed Care – PPO | Attending: Obstetrics and Gynecology

## 2023-10-13 ENCOUNTER — Ambulatory Visit: Payer: BC Managed Care – PPO | Admitting: *Deleted

## 2023-10-13 ENCOUNTER — Other Ambulatory Visit: Payer: Self-pay

## 2023-10-13 ENCOUNTER — Encounter: Payer: Self-pay | Admitting: *Deleted

## 2023-10-13 VITALS — BP 121/53 | HR 80

## 2023-10-13 DIAGNOSIS — O09529 Supervision of elderly multigravida, unspecified trimester: Secondary | ICD-10-CM | POA: Insufficient documentation

## 2023-10-13 DIAGNOSIS — O358XX0 Maternal care for other (suspected) fetal abnormality and damage, not applicable or unspecified: Secondary | ICD-10-CM | POA: Diagnosis not present

## 2023-10-13 DIAGNOSIS — Z363 Encounter for antenatal screening for malformations: Secondary | ICD-10-CM | POA: Diagnosis present

## 2023-10-13 DIAGNOSIS — Z3A24 24 weeks gestation of pregnancy: Secondary | ICD-10-CM | POA: Diagnosis not present

## 2023-10-13 DIAGNOSIS — O34219 Maternal care for unspecified type scar from previous cesarean delivery: Secondary | ICD-10-CM

## 2023-10-13 DIAGNOSIS — O283 Abnormal ultrasonic finding on antenatal screening of mother: Secondary | ICD-10-CM | POA: Insufficient documentation

## 2023-10-13 DIAGNOSIS — O09522 Supervision of elderly multigravida, second trimester: Secondary | ICD-10-CM | POA: Diagnosis not present

## 2023-10-13 DIAGNOSIS — O365920 Maternal care for other known or suspected poor fetal growth, second trimester, not applicable or unspecified: Secondary | ICD-10-CM | POA: Diagnosis present

## 2023-12-08 ENCOUNTER — Encounter: Payer: BC Managed Care – PPO | Attending: Obstetrics and Gynecology | Admitting: Dietician

## 2023-12-08 DIAGNOSIS — O99810 Abnormal glucose complicating pregnancy: Secondary | ICD-10-CM | POA: Insufficient documentation

## 2023-12-08 NOTE — Progress Notes (Signed)
 Patient was seen on 12/08/2023 for Gestational Diabetes self-management class at the [facility]. The following learning objectives were met by the patient during this course:  States the definition of Gestational Diabetes States why dietary management is important in controlling blood glucose Describes the effects each nutrient has on blood glucose levels Demonstrates ability to create a balanced meal plan Demonstrates carbohydrate counting  States when to check blood glucose levels Demonstrates proper blood glucose monitoring techniques States the effect of stress and exercise on blood glucose levels States the importance of limiting caffeine and abstaining from alcohol and smoking  Blood glucose monitor: Pt presents with Accu Chek Guide glucometer   Fasting:  86-101 mg/dL  2 hour post prandial: 89-122 mg/dL    Patient instructed to monitor glucose levels: QID FBS: 60 - <90 1 hour: <140 2 hour: <120  *Patient received handouts: Nutrition Diabetes and Pregnancy Carbohydrate Counting List Blood glucose log Snack ideas for diabetes during pregnancy  Patient will be seen for follow-up as needed.

## 2023-12-31 LAB — OB RESULTS CONSOLE GBS: GBS: NEGATIVE

## 2024-01-11 NOTE — Patient Instructions (Signed)
Brooklin Rieger  01/11/2024   Your procedure is scheduled on:  01/24/2024  Arrive at 0530 at Entrance C on [employer] at [facility]  and [employer]. You are invited to use the FREE valet parking or use the Visitor's parking deck.  Pick up the phone at the desk and dial [phone].  Call this number if you have problems the morning of surgery: [phone]  Remember:   Do not eat food:(After Midnight) Desps de medianoche.  You may drink clear liquids until arrival at __0530___.  Clear liquids means a liquid you can see thru.  It can have color such as Cola or Kool aid.  Tea is OK and coffee as long as no milk or creamer of any kind.  Take these medicines the morning of surgery with A SIP OF WATER:  none   Do not wear jewelry, make-up or nail polish.  Do not wear lotions, powders, or perfumes. Do not wear deodorant.  Do not shave 48 hours prior to surgery.  Do not bring valuables to the hospital.  [facility] is not   responsible for any belongings or valuables brought to the hospital.  Contacts, dentures or bridgework may not be worn into surgery.  Leave suitcase in the car. After surgery it may be brought to your room.  For patients admitted to the hospital, checkout time is 11:00 AM the day of              discharge.      Please read over the following fact sheets that you were given:     Preparing for Surgery

## 2024-01-14 ENCOUNTER — Encounter (HOSPITAL_COMMUNITY): Payer: Self-pay

## 2024-01-21 ENCOUNTER — Encounter (HOSPITAL_COMMUNITY)
Admission: RE | Admit: 2024-01-21 | Discharge: 2024-01-21 | Disposition: A | Discharge status: Home / Self Care | Payer: BC Managed Care – PPO | Source: Ambulatory Visit | Attending: Obstetrics and Gynecology | Admitting: Obstetrics and Gynecology

## 2024-01-21 VITALS — Ht 66.0 in | Wt 197.0 lb

## 2024-01-21 DIAGNOSIS — Z01812 Encounter for preprocedural laboratory examination: Secondary | ICD-10-CM | POA: Insufficient documentation

## 2024-01-21 DIAGNOSIS — O09529 Supervision of elderly multigravida, unspecified trimester: Secondary | ICD-10-CM

## 2024-01-21 HISTORY — DX: Gestational diabetes mellitus in pregnancy, unspecified control: O24.419

## 2024-01-21 LAB — TYPE AND SCREEN
ABO/RH(D): O POS
Antibody Screen: NEGATIVE

## 2024-01-21 LAB — CBC
HCT: 40.5 % (ref 36.0–46.0)
Hemoglobin: 13.1 g/dL (ref 12.0–15.0)
MCH: 30.3 pg (ref 26.0–34.0)
MCHC: 32.3 g/dL (ref 30.0–36.0)
MCV: 93.8 fL (ref 80.0–100.0)
Platelets: 153 10*3/uL (ref 150–400)
RBC: 4.32 MIL/uL (ref 3.87–5.11)
RDW: 14.7 % (ref 11.5–15.5)
WBC: 8.5 10*3/uL (ref 4.0–10.5)
nRBC: 0 % (ref 0.0–0.2)

## 2024-01-21 LAB — RPR: RPR Ser Ql: NONREACTIVE

## 2024-01-23 NOTE — H&P (Signed)
 Hannah Coffey is a 38 y.o. female G2P1001 @ 39 weeks 2 days presenting for scheduled repeat cesarean and bilateral salpingectomy.  She has no complaints. Denies contractions, leakage of fluid, vaginal bleeding. Good fetal movements.   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 AMA: low risk NIPT Anxiety: stable on lexapro 10mg    OB History     Gravida  2   Para  1   Term  1   Preterm      AB      Living  1      SAB      IAB      Ectopic      Multiple  0   Live Births  1          Past Medical History:  Diagnosis Date   Anxiety    Gestational diabetes    PCOS (polycystic ovarian syndrome)    Past Surgical History:  Procedure Laterality Date   CESAREAN SECTION N/A 03/23/2020   Procedure: CESAREAN SECTION;  Surgeon: Philip Aspen, DO;  Location: [facility] LD ORS;  Service: Obstetrics;  Laterality: N/A;   WISDOM TOOTH EXTRACTION     Family History: family history includes Breast cancer in her paternal aunt; Multiple sclerosis in her maternal grandmother and mother. Social History:  reports that she has never smoked. She has never used smokeless tobacco. She reports that she does not currently use alcohol. She reports that she does not use drugs.     Maternal Diabetes: Yes:  Diabetes Type:  Insulin/Medication controlled Genetic Screening: Normal Maternal Ultrasounds/Referrals: Normal Fetal Ultrasounds or other Referrals:  None Maternal Substance Abuse:  No Significant Maternal Medications:  Meds include: Other: Lexapro Significant Maternal Lab Results:  Group B Strep negative Number of Prenatal Visits:greater than 3 verified prenatal visits Maternal Vaccinations:RSV: Given during pregnancy >/=14 days ago, TDap, Flu, and Covid Other Comments:  None  Review of Systems as noted in HPI History   Last menstrual period 04/23/2023, unknown if  currently breastfeeding. Exam Physical Exam   Gen: well appearing CVS: normal pulses Lungs: nonlabored respirations Abd: gravid Ext: no calf edema or tenderness    Prenatal labs: ABO, Rh: --/--/O POS (02/21 1610) Antibody: NEG (02/21 0924) Rubella: Immune (08/26 0000) RPR: NON REACTIVE (02/21 0920)  HBsAg: Negative (08/26 0000)  HIV: Non-reactive (08/26 0000)  GBS: Negative/-- (01/31 0000)   Assessment/Plan: 37Y G2P1001 @ 39 weeks 2 day, with desire for repeat cesarean and permanent sterilization Plan:  scheduled repeat cesarean and bilateral salpingectomy  Informed consent obtained. Reviewed risks/benefits/alternatives, including risk of infection, bleeding, damage to surrounding organs, blood transfusion, hysterectomy, injury to infant. All questions answered Ancef 2g  Hannah Coffey 01/23/2024, 7:12 PM

## 2024-01-24 ENCOUNTER — Inpatient Hospital Stay (HOSPITAL_COMMUNITY): Payer: BC Managed Care – PPO | Admitting: Anesthesiology

## 2024-01-24 ENCOUNTER — Other Ambulatory Visit: Payer: Self-pay

## 2024-01-24 ENCOUNTER — Encounter (HOSPITAL_COMMUNITY)
Admission: RE | Disposition: A | Discharge status: Home / Self Care | Payer: Self-pay | Source: Home / Self Care | Attending: Obstetrics and Gynecology

## 2024-01-24 ENCOUNTER — Encounter (HOSPITAL_COMMUNITY): Payer: Self-pay | Admitting: Obstetrics and Gynecology

## 2024-01-24 ENCOUNTER — Inpatient Hospital Stay (HOSPITAL_COMMUNITY)
Admission: RE | Admit: 2024-01-24 | Discharge: 2024-01-26 | DRG: 785 | Disposition: A | Discharge status: Home / Self Care | Payer: BC Managed Care – PPO | Attending: Obstetrics and Gynecology | Admitting: Obstetrics and Gynecology

## 2024-01-24 DIAGNOSIS — Z3A39 39 weeks gestation of pregnancy: Secondary | ICD-10-CM

## 2024-01-24 DIAGNOSIS — Z302 Encounter for sterilization: Secondary | ICD-10-CM

## 2024-01-24 DIAGNOSIS — O24425 Gestational diabetes mellitus in childbirth, controlled by oral hypoglycemic drugs: Secondary | ICD-10-CM | POA: Diagnosis present

## 2024-01-24 DIAGNOSIS — F419 Anxiety disorder, unspecified: Secondary | ICD-10-CM | POA: Diagnosis present

## 2024-01-24 DIAGNOSIS — D252 Subserosal leiomyoma of uterus: Secondary | ICD-10-CM | POA: Diagnosis present

## 2024-01-24 DIAGNOSIS — O3413 Maternal care for benign tumor of corpus uteri, third trimester: Secondary | ICD-10-CM | POA: Diagnosis present

## 2024-01-24 DIAGNOSIS — O99344 Other mental disorders complicating childbirth: Secondary | ICD-10-CM | POA: Diagnosis present

## 2024-01-24 DIAGNOSIS — O34211 Maternal care for low transverse scar from previous cesarean delivery: Secondary | ICD-10-CM | POA: Diagnosis present

## 2024-01-24 DIAGNOSIS — Z98891 History of uterine scar from previous surgery: Principal | ICD-10-CM

## 2024-01-24 HISTORY — PX: TUBAL LIGATION: SHX77

## 2024-01-24 LAB — GLUCOSE, CAPILLARY
Glucose-Capillary: 76 mg/dL (ref 70–99)
Glucose-Capillary: 83 mg/dL (ref 70–99)

## 2024-01-24 SURGERY — Surgical Case
Anesthesia: Epidural

## 2024-01-24 MED ORDER — TETANUS-DIPHTH-ACELL PERTUSSIS 5-2.5-18.5 LF-MCG/0.5 IM SUSY: 0.5000 mL | PREFILLED_SYRINGE | Freq: Once | INTRAMUSCULAR | Status: DC

## 2024-01-24 MED ORDER — PRENATAL MULTIVITAMIN CH
1.0000 | ORAL_TABLET | Freq: Every day | ORAL | Status: DC
  Administered 2024-01-25: 1 via ORAL
  Filled 2024-01-24: qty 1

## 2024-01-24 MED ORDER — KETOROLAC TROMETHAMINE 30 MG/ML IJ SOLN
INTRAMUSCULAR | Status: AC
  Filled 2024-01-24: qty 1

## 2024-01-24 MED ORDER — SENNOSIDES-DOCUSATE SODIUM 8.6-50 MG PO TABS
2.0000 | ORAL_TABLET | ORAL | Status: DC
  Administered 2024-01-25 – 2024-01-26 (×2): 2 via ORAL
  Filled 2024-01-24 (×2): qty 2

## 2024-01-24 MED ORDER — ACETAMINOPHEN 500 MG PO TABS
1000.0000 mg | ORAL_TABLET | Freq: Four times a day (QID) | ORAL | Status: DC
  Administered 2024-01-24 – 2024-01-26 (×8): 1000 mg via ORAL
  Filled 2024-01-24 (×8): qty 2

## 2024-01-24 MED ORDER — DIPHENHYDRAMINE HCL 25 MG PO CAPS: 25.0000 mg | ORAL_CAPSULE | ORAL | Status: DC | PRN

## 2024-01-24 MED ORDER — NALOXONE HCL 0.4 MG/ML IJ SOLN: 0.4000 mg | INTRAMUSCULAR | Status: DC | PRN

## 2024-01-24 MED ORDER — BUPIVACAINE IN DEXTROSE 0.75-8.25 % IT SOLN
INTRATHECAL | Status: DC | PRN
  Administered 2024-01-24: 1.6 mL via INTRATHECAL

## 2024-01-24 MED ORDER — DIPHENHYDRAMINE HCL 25 MG PO CAPS: 25.0000 mg | ORAL_CAPSULE | Freq: Four times a day (QID) | ORAL | Status: DC | PRN

## 2024-01-24 MED ORDER — MENTHOL 3 MG MT LOZG: 1.0000 | LOZENGE | OROMUCOSAL | Status: DC | PRN

## 2024-01-24 MED ORDER — OXYTOCIN-SODIUM CHLORIDE 30-0.9 UT/500ML-% IV SOLN
2.5000 [IU]/h | INTRAVENOUS | Status: AC
Start: 2024-01-24 — End: 2024-01-25
  Administered 2024-01-24: 2.5 [IU]/h via INTRAVENOUS
  Filled 2024-01-24: qty 500

## 2024-01-24 MED ORDER — ONDANSETRON HCL 4 MG/2ML IJ SOLN: 4.0000 mg | Freq: Three times a day (TID) | INTRAMUSCULAR | Status: DC | PRN

## 2024-01-24 MED ORDER — MEPERIDINE HCL 25 MG/ML IJ SOLN: 6.2500 mg | INTRAMUSCULAR | Status: DC | PRN

## 2024-01-24 MED ORDER — PHENYLEPHRINE 80 MCG/ML (10ML) SYRINGE FOR IV PUSH (FOR BLOOD PRESSURE SUPPORT)
PREFILLED_SYRINGE | INTRAVENOUS | Status: AC
  Filled 2024-01-24: qty 10

## 2024-01-24 MED ORDER — ACETAMINOPHEN 10 MG/ML IV SOLN
INTRAVENOUS | Status: DC | PRN
  Administered 2024-01-24: 1000 mg via INTRAVENOUS

## 2024-01-24 MED ORDER — OXYTOCIN-SODIUM CHLORIDE 30-0.9 UT/500ML-% IV SOLN
INTRAVENOUS | Status: DC | PRN
  Administered 2024-01-24: 300 mL via INTRAVENOUS

## 2024-01-24 MED ORDER — MORPHINE SULFATE (PF) 0.5 MG/ML IJ SOLN
INTRAMUSCULAR | Status: DC | PRN
  Administered 2024-01-24: 150 ug via INTRATHECAL

## 2024-01-24 MED ORDER — IBUPROFEN 600 MG PO TABS
600.0000 mg | ORAL_TABLET | Freq: Four times a day (QID) | ORAL | Status: DC
  Administered 2024-01-25 – 2024-01-26 (×5): 600 mg via ORAL
  Filled 2024-01-24 (×5): qty 1

## 2024-01-24 MED ORDER — HYDROMORPHONE HCL 1 MG/ML IJ SOLN
0.2000 mg | INTRAMUSCULAR | Status: DC | PRN
Start: 2024-01-24 — End: 2024-01-26

## 2024-01-24 MED ORDER — COCONUT OIL OIL: 1.0000 | TOPICAL_OIL | Status: DC | PRN

## 2024-01-24 MED ORDER — ONDANSETRON HCL 4 MG/2ML IJ SOLN
INTRAMUSCULAR | Status: DC | PRN
Start: 2024-01-24 — End: 2024-01-24
  Administered 2024-01-24: 4 mg via INTRAVENOUS

## 2024-01-24 MED ORDER — ONDANSETRON HCL 4 MG/2ML IJ SOLN
INTRAMUSCULAR | Status: AC
  Filled 2024-01-24: qty 2

## 2024-01-24 MED ORDER — SODIUM CHLORIDE 0.9% FLUSH: 3.0000 mL | Freq: Two times a day (BID) | INTRAVENOUS | Status: DC

## 2024-01-24 MED ORDER — DEXAMETHASONE SODIUM PHOSPHATE 4 MG/ML IJ SOLN
INTRAMUSCULAR | Status: AC
  Filled 2024-01-24: qty 2

## 2024-01-24 MED ORDER — METOCLOPRAMIDE HCL 5 MG/ML IJ SOLN
INTRAMUSCULAR | Status: AC
  Filled 2024-01-24: qty 2

## 2024-01-24 MED ORDER — KETOROLAC TROMETHAMINE 30 MG/ML IJ SOLN
30.0000 mg | Freq: Once | INTRAMUSCULAR | Status: AC | PRN
  Administered 2024-01-24: 30 mg via INTRAVENOUS

## 2024-01-24 MED ORDER — DIBUCAINE (PERIANAL) 1 % EX OINT: 1.0000 | TOPICAL_OINTMENT | CUTANEOUS | Status: DC | PRN

## 2024-01-24 MED ORDER — LACTATED RINGERS IV SOLN: INTRAVENOUS | Status: DC

## 2024-01-24 MED ORDER — SODIUM CHLORIDE 0.9 % IV SOLN
500.0000 mg | INTRAVENOUS | Status: DC
  Filled 2024-01-24: qty 5

## 2024-01-24 MED ORDER — MORPHINE SULFATE (PF) 0.5 MG/ML IJ SOLN
INTRAMUSCULAR | Status: AC
  Filled 2024-01-24: qty 10

## 2024-01-24 MED ORDER — SCOPOLAMINE 1 MG/3DAYS TD PT72: 1.0000 | MEDICATED_PATCH | Freq: Once | TRANSDERMAL | Status: DC

## 2024-01-24 MED ORDER — ESCITALOPRAM OXALATE 10 MG PO TABS
10.0000 mg | ORAL_TABLET | Freq: Every day | ORAL | Status: DC
  Administered 2024-01-25 – 2024-01-26 (×2): 10 mg via ORAL
  Filled 2024-01-24 (×2): qty 1

## 2024-01-24 MED ORDER — FENTANYL CITRATE (PF) 100 MCG/2ML IJ SOLN
INTRAMUSCULAR | Status: AC
  Filled 2024-01-24: qty 2

## 2024-01-24 MED ORDER — STERILE WATER FOR IRRIGATION IR SOLN
Status: DC | PRN
  Administered 2024-01-24: 1

## 2024-01-24 MED ORDER — ACETAMINOPHEN 10 MG/ML IV SOLN
INTRAVENOUS | Status: AC
  Filled 2024-01-24: qty 100

## 2024-01-24 MED ORDER — FENTANYL CITRATE (PF) 100 MCG/2ML IJ SOLN
INTRAMUSCULAR | Status: DC | PRN
  Administered 2024-01-24: 15 ug via INTRATHECAL

## 2024-01-24 MED ORDER — SIMETHICONE 80 MG PO CHEW: 80.0000 mg | CHEWABLE_TABLET | ORAL | Status: DC | PRN

## 2024-01-24 MED ORDER — KETOROLAC TROMETHAMINE 30 MG/ML IJ SOLN
30.0000 mg | Freq: Four times a day (QID) | INTRAMUSCULAR | Status: AC
  Administered 2024-01-24 – 2024-01-25 (×3): 30 mg via INTRAVENOUS
  Filled 2024-01-24 (×3): qty 1

## 2024-01-24 MED ORDER — DIPHENHYDRAMINE HCL 50 MG/ML IJ SOLN: 12.5000 mg | INTRAMUSCULAR | Status: DC | PRN

## 2024-01-24 MED ORDER — NALOXONE HCL 4 MG/10ML IJ SOLN: 1.0000 ug/kg/h | INTRAVENOUS | Status: DC | PRN

## 2024-01-24 MED ORDER — OXYTOCIN-SODIUM CHLORIDE 30-0.9 UT/500ML-% IV SOLN
INTRAVENOUS | Status: AC
  Filled 2024-01-24: qty 500

## 2024-01-24 MED ORDER — PHENYLEPHRINE HCL-NACL 20-0.9 MG/250ML-% IV SOLN
INTRAVENOUS | Status: DC | PRN
  Administered 2024-01-24: 60 ug/min via INTRAVENOUS

## 2024-01-24 MED ORDER — SODIUM CHLORIDE 0.9% FLUSH: 3.0000 mL | INTRAVENOUS | Status: DC | PRN

## 2024-01-24 MED ORDER — SCOPOLAMINE 1 MG/3DAYS TD PT72
MEDICATED_PATCH | TRANSDERMAL | Status: DC | PRN
  Administered 2024-01-24: 1 via TRANSDERMAL

## 2024-01-24 MED ORDER — CEFAZOLIN SODIUM-DEXTROSE 2-4 GM/100ML-% IV SOLN
2.0000 g | INTRAVENOUS | Status: AC
  Administered 2024-01-24: 2 g via INTRAVENOUS

## 2024-01-24 MED ORDER — WITCH HAZEL-GLYCERIN EX PADS: 1.0000 | MEDICATED_PAD | CUTANEOUS | Status: DC | PRN

## 2024-01-24 MED ORDER — DEXAMETHASONE SODIUM PHOSPHATE 10 MG/ML IJ SOLN
INTRAMUSCULAR | Status: DC | PRN
  Administered 2024-01-24: 8 mg via INTRAVENOUS

## 2024-01-24 MED ORDER — CEFAZOLIN SODIUM-DEXTROSE 2-4 GM/100ML-% IV SOLN
INTRAVENOUS | Status: AC
Start: 2024-01-24 — End: ?
  Filled 2024-01-24: qty 100

## 2024-01-24 MED ORDER — SIMETHICONE 80 MG PO CHEW
80.0000 mg | CHEWABLE_TABLET | Freq: Three times a day (TID) | ORAL | Status: DC
  Administered 2024-01-24 – 2024-01-26 (×5): 80 mg via ORAL
  Filled 2024-01-24 (×5): qty 1

## 2024-01-24 MED ORDER — PHENYLEPHRINE HCL-NACL 20-0.9 MG/250ML-% IV SOLN
INTRAVENOUS | Status: AC
  Filled 2024-01-24: qty 250

## 2024-01-24 MED ORDER — OXYCODONE HCL 5 MG PO TABS
5.0000 mg | ORAL_TABLET | ORAL | Status: DC | PRN
  Administered 2024-01-25: 10 mg via ORAL
  Administered 2024-01-25 (×2): 5 mg via ORAL
  Administered 2024-01-25 – 2024-01-26 (×3): 10 mg via ORAL
  Filled 2024-01-24: qty 1
  Filled 2024-01-24 (×2): qty 2
  Filled 2024-01-24: qty 1
  Filled 2024-01-24 (×2): qty 2

## 2024-01-24 MED ORDER — SODIUM CHLORIDE 0.9 % IR SOLN
Status: DC | PRN
  Administered 2024-01-24: 1

## 2024-01-24 MED ORDER — SODIUM CHLORIDE 0.9% FLUSH
3.0000 mL | INTRAVENOUS | Status: DC | PRN
  Administered 2024-01-24: 10 mL via INTRAVENOUS

## 2024-01-24 MED ORDER — SOD CITRATE-CITRIC ACID 500-334 MG/5ML PO SOLN: 30.0000 mL | ORAL | Status: DC

## 2024-01-24 SURGICAL SUPPLY — 32 items
BENZOIN TINCTURE PRP APPL 2/3 (GAUZE/BANDAGES/DRESSINGS) IMPLANT
CHLORAPREP W/TINT 26 (MISCELLANEOUS) ×4 IMPLANT
CLAMP UMBILICAL CORD (MISCELLANEOUS) ×2 IMPLANT
CLOTH BEACON ORANGE TIMEOUT ST (SAFETY) ×2 IMPLANT
DERMABOND ADVANCED .7 DNX12 (GAUZE/BANDAGES/DRESSINGS) ×2 IMPLANT
DRSG OPSITE POSTOP 4X10 (GAUZE/BANDAGES/DRESSINGS) ×2 IMPLANT
ELECT REM PT RETURN 9FT ADLT (ELECTROSURGICAL) ×2 IMPLANT
ELECTRODE REM PT RTRN 9FT ADLT (ELECTROSURGICAL) ×2 IMPLANT
EXTRACTOR VACUUM KIWI (MISCELLANEOUS) ×2 IMPLANT
GAUZE SPONGE 4X4 12PLY STRL LF (GAUZE/BANDAGES/DRESSINGS) IMPLANT
GLOVE BIO SURGEON STRL SZ 6.5 (GLOVE) ×2 IMPLANT
GLOVE BIOGEL PI IND STRL 7.0 (GLOVE) ×6 IMPLANT
GOWN STRL REUS W/TWL LRG LVL3 (GOWN DISPOSABLE) ×4 IMPLANT
KIT ABG SYR 3ML LUER SLIP (SYRINGE) IMPLANT
LIGASURE IMPACT 36 18CM CVD LR (INSTRUMENTS) ×2 IMPLANT
NDL HYPO 25X5/8 SAFETYGLIDE (NEEDLE) IMPLANT
NEEDLE HYPO 25X5/8 SAFETYGLIDE (NEEDLE) IMPLANT
NS IRRIG 1000ML POUR BTL (IV SOLUTION) ×2 IMPLANT
PACK C SECTION WH (CUSTOM PROCEDURE TRAY) ×2 IMPLANT
PAD ABD DERMACEA PRESS 5X9 (GAUZE/BANDAGES/DRESSINGS) IMPLANT
PAD OB MATERNITY 4.3X12.25 (PERSONAL CARE ITEMS) ×2 IMPLANT
PENCIL SMOKE EVACUATOR (MISCELLANEOUS) IMPLANT
RTRCTR C-SECT PINK 25CM LRG (MISCELLANEOUS) ×2 IMPLANT
STRIP CLOSURE SKIN 1/2X4 (GAUZE/BANDAGES/DRESSINGS) IMPLANT
SUT MNCRL 0 VIOLET CTX 36 (SUTURE) ×6 IMPLANT
SUT VIC AB 0 CT1 36 (SUTURE) IMPLANT
SUT VIC AB 2-0 CT1 TAPERPNT 27 (SUTURE) IMPLANT
SUT VIC AB 4-0 KS 27 (SUTURE) ×2 IMPLANT
SUTURE PLAIN GUT 2.0 ETHICON (SUTURE) ×2 IMPLANT
TOWEL OR 17X24 6PK STRL BLUE (TOWEL DISPOSABLE) ×2 IMPLANT
TRAY FOLEY W/BAG SLVR 14FR LF (SET/KITS/TRAYS/PACK) ×2 IMPLANT
WATER STERILE IRR 1000ML POUR (IV SOLUTION) ×2 IMPLANT

## 2024-01-24 NOTE — Anesthesia Preprocedure Evaluation (Addendum)
 Anesthesia Evaluation  Patient identified by MRN, date of birth, ID band Patient awake    Reviewed: Patient's Chart, lab work & pertinent test results  Airway Mallampati: II  TM Distance: >3 FB Neck ROM: Full    Dental no notable dental hx.    Pulmonary neg pulmonary ROS   Pulmonary exam normal        Cardiovascular negative cardio ROS  Rhythm:Regular Rate:Normal     Neuro/Psych   Anxiety     negative neurological ROS     GI/Hepatic negative GI ROS, Neg liver ROS,,,  Endo/Other  diabetes, Gestational    Renal/GU negative Renal ROS  negative genitourinary   Musculoskeletal negative musculoskeletal ROS (+)    Abdominal Normal abdominal exam  (+)   Peds  Hematology Lab Results      Component                Value               Date                      WBC                      8.5                 01/21/2024                HGB                      13.1                01/21/2024                HCT                      40.5                01/21/2024                MCV                      93.8                01/21/2024                PLT                      153                 01/21/2024               Anesthesia Other Findings   Reproductive/Obstetrics (+) Pregnancy                             Anesthesia Physical Anesthesia Plan  ASA: 2  Anesthesia Plan: Spinal   Post-op Pain Management:    Induction:   PONV Risk Score and Plan: 2 and Treatment may vary due to age or medical condition, Ondansetron and Dexamethasone  Airway Management Planned: Simple Face Mask and Nasal Cannula  Additional Equipment: None  Intra-op Plan:   Post-operative Plan:   Informed Consent: I have reviewed the patients History and Physical, chart, labs and discussed the procedure including the risks, benefits and alternatives for the proposed anesthesia with the patient or authorized representative who has  indicated his/her understanding and acceptance.     Dental advisory given  Plan Discussed with: CRNA  Anesthesia Plan Comments:        Anesthesia Quick Evaluation

## 2024-01-24 NOTE — Anesthesia Postprocedure Evaluation (Signed)
 Anesthesia Post Note  Patient: Hannah Coffey  Procedure(s) Performed: CESAREAN SECTION WITH BILATERAL TUBAL LIGATION BILATERAL TUBAL LIGATION (Bilateral)     Patient location during evaluation: PACU Anesthesia Type: Epidural Level of consciousness: awake and alert Pain management: pain level controlled Vital Signs Assessment: post-procedure vital signs reviewed and stable Respiratory status: spontaneous breathing, nonlabored ventilation, respiratory function stable and patient connected to nasal cannula oxygen Cardiovascular status: stable and blood pressure returned to baseline Postop Assessment: no apparent nausea or vomiting Anesthetic complications: no   No notable events documented.  Last Vitals:  Vitals:   01/24/24 1215 01/24/24 1325  BP: 109/64 108/67  Pulse: 80 83  Resp: 16 17  Temp: 36.5 C 36.8 C  SpO2: 98% 96%    Last Pain:  Vitals:   01/24/24 1325  TempSrc: Oral  PainSc: 2                  Nelle Don Parth Mccormac

## 2024-01-24 NOTE — Anesthesia Procedure Notes (Signed)
 Spinal  Patient location during procedure: OR Start time: 01/24/2024 7:38 AM End time: 01/24/2024 7:40 AM Staffing Performed: anesthesiologist  Anesthesiologist: Atilano Median, DO Performed by: Atilano Median, DO Authorized by: Atilano Median, DO   Preanesthetic Checklist Completed: patient identified, IV checked, site marked, risks and benefits discussed, surgical consent, monitors and equipment checked, pre-op evaluation and timeout performed Spinal Block Patient position: sitting Prep: DuraPrep Patient monitoring: heart rate, cardiac monitor, continuous pulse ox and blood pressure Approach: midline Location: L4-5 Injection technique: single-shot Needle Needle type: Pencan  Needle gauge: 24 G Needle length: 10 cm Assessment Events: CSF return Additional Notes Patient identified. Risks/Benefits/Options discussed with patient including but not limited to bleeding, infection, nerve damage, paralysis, failed block, incomplete pain control, headache, blood pressure changes, nausea, vomiting, reactions to medications, itching and postpartum back pain. Confirmed with bedside nurse the patient's most recent platelet count. Confirmed with patient that they are not currently taking any anticoagulation, have any bleeding history or any family history of bleeding disorders. Patient expressed understanding and wished to proceed. All questions were answered. Sterile technique was used throughout the entire procedure. Please see nursing notes for vital signs. Warning signs of high block given to the patient including shortness of breath, tingling/numbness in hands, complete motor block, or any concerning symptoms with instructions to call for help. Patient was given instructions on fall risk and not to get out of bed. All questions and concerns addressed with instructions to call with any issues or inadequate analgesia.

## 2024-01-24 NOTE — Op Note (Signed)
 CESAREAN SECTION Procedure Note  Patient: Hannah Coffey is a 38 y.o. G2P1001 @ [redacted]w[redacted]d  Preoperative Diagnosis:  Intrauterine pregnancy at 39 weeks 3 days History of cesarean delivery, desire for repeat Desire for permanent sterilization Gestational diabetes, A2  Postoperative Diagnosis: same, delivered  Procedure: Repeat cesarean and bilateral salpingectomy     Surgeon: [name], MD  Assistant: Carrington Clamp, MD  An experienced assistant was required given the standard of surgical care given the complexity of the case.  This assistant was needed for exposure, dissection, suctioning, retraction, instrument exchange, assisting with delivery with administration of fundal pressure, and for overall help during the procedure.  Anesthesia: Spinal anesthesia   Findings: Normal appearing uterus, fallopian tubes bilaterally, and ovaries bilaterally.  1cm subserosal fibroid on anterior right uterine fundus. Viable female infant in vertex presentation delivered at 0801 with weight 3330g (7 pounds 5.5ounces), Apgars 9 and 9.  Estimated Blood Loss:          Specimens: Bilateral fallopian tubes to pathology. Placenta to L&D for disposal         Complications:  None         Disposition: PACU - hemodynamically stable.         Condition: stable    Description of Procedure: The patient was taken to the operating room where spinal anesthesia was placed and found to be adequate.  The patient was placed in the dorsal supine position.  Fetal heart tones were confirmed. Thromboguards were applied and cycling. A foley catheter was inserted and draining. Ancef 2g was given for infection prophylaxis. The patient was subsequently prepped and draped in the normal sterile fashion.    A low transverse skin incision was made with a scalpel and carried down to the level of the fascia with the Bovie.  The fascia was incised in the midline with the scalpel and extended laterally with curved Mayo  scissors.  Kocher clamps were applied to the inferior fascial edge and the fascia was dissected off the rectus muscle sharply using the Mayo scissors.  The Kocher clamps were transferred to the superior fascial edge and the underlying rectus muscle was dissected off with curved Mayo's scissors.  The rectus muscles then were separated in the midline.  The peritoneum was found free of adherent bowel and the peritoneal cavity was entered with Metzenbaum scissors.  The uterus was identified and the Alexis retractor was placed intraperitoneal.  A bladder flap was then created sharply with Metzenbaum scissors and separated from the lower uterine segment digitally.   A low transverse hysterotomy was then made with a scalpel.  The infant was found in the vertex presentation was delivered atraumatically and without difficulty with standard maneuvers. After 60 seconds of delayed cord clamping the cord was clamped and cut and the infant was handed off to the pediatricians.  The placenta was delivered with gentle traction on umbilical cord and manual massage of the uterine fundus.  The uterus was cleared of all clot and debris.  The hysterotomy was then closed with 0 monocryl in a running locked fashion,  followed by 0 Monocryl in an imbricating fashion.  The hysterotomy was found to be hemostatic. Bilateral salpingectomy was performed using the Ligasure to serially ligate the mesosalpinx and free each fallopian tube. The fallopian tubes were sent for pathology. Hemostasis was appreciated at both sites. The peritoneum was closed with 2-0 vicryl in a running fashion. The rectus muscle was reapproximated with 2-0 vicryl. The fascia was closed  with a 0 PDS suture in a continuous running fashion.  The subcutaneous tissue was irrigated and rendered hemostatic with cautery.  The subcutaneous layer was subsequently closed with 2-0 plain gut in a continuous running fashion.  The skin was closed with 4-0 vicryl  in a running  subcuticular fashion.  Sponge, lap and needle counts were correct. Steri strips and a Honeycomb dressing were placed on the incision. The patient was taken to the recovery room in stable condition.   [name] 01/24/24  8:40 AM

## 2024-01-24 NOTE — Interval H&P Note (Signed)
 History and Physical Interval Note:  01/24/2024 7:16 AM  Hannah Coffey  has presented today for surgery, with the diagnosis of REPEAT CESAREAN SECTION.  The various methods of treatment have been discussed with the patient and family. After consideration of risks, benefits and other options for treatment, the patient has consented to CESAREAN SECTION WITH BILATERAL SALPINGECTOMY as a surgical intervention.  The patient's history has been reviewed, patient examined, no change in status, stable for surgery.  I have reviewed the patient's chart and labs.  Questions were answered to the patient's satisfaction.     [name]

## 2024-01-24 NOTE — Transfer of Care (Signed)
 Immediate Anesthesia Transfer of Care Note  Patient: Hannah Coffey  Procedure(s) Performed: CESAREAN SECTION WITH BILATERAL TUBAL LIGATION BILATERAL TUBAL LIGATION (Bilateral)  Patient Location: PACU  Anesthesia Type:Spinal  Level of Consciousness: awake, alert , and oriented  Airway & Oxygen Therapy: Patient Spontanous Breathing  Post-op Assessment: Report given to RN and Post -op Vital signs reviewed and stable  Post vital signs: Reviewed and stable  Last Vitals:  Vitals Value Taken Time  BP 116/71 01/24/24 0851  Temp    Pulse 79 01/24/24 0853  Resp 17 01/24/24 0853  SpO2 95 % 01/24/24 0853  Vitals shown include unfiled device data.  Last Pain:  Vitals:   01/24/24 0550  TempSrc: Oral         Complications: No notable events documented.

## 2024-01-25 LAB — CBC
HCT: 31.5 % — ABNORMAL LOW (ref 36.0–46.0)
Hemoglobin: 10.3 g/dL — ABNORMAL LOW (ref 12.0–15.0)
MCH: 30.2 pg (ref 26.0–34.0)
MCHC: 32.7 g/dL (ref 30.0–36.0)
MCV: 92.4 fL (ref 80.0–100.0)
Platelets: 137 10*3/uL — ABNORMAL LOW (ref 150–400)
RBC: 3.41 MIL/uL — ABNORMAL LOW (ref 3.87–5.11)
RDW: 14.8 % (ref 11.5–15.5)
WBC: 11.6 10*3/uL — ABNORMAL HIGH (ref 4.0–10.5)
nRBC: 0 % (ref 0.0–0.2)

## 2024-01-25 LAB — BIRTH TISSUE RECOVERY COLLECTION (PLACENTA DONATION)

## 2024-01-25 NOTE — Progress Notes (Signed)
 Subjective: Postpartum/Post-op Day 1: Cesarean Delivery Patient reports pain is well controlled. Ambulating with no problems. tolerating PO, + flatus, and no problems voiding.  Normal lochia  Objective: Vital signs in last 24 hours: Temp:  [97.7 F (36.5 C)-98.6 F (37 C)] 98.6 F (37 C) (02/25 0040) Pulse Rate:  [80-83] 83 (02/24 1325) Resp:  [16-18] 18 (02/25 0040) BP: (94-109)/(55-67) 99/58 (02/25 0040) SpO2:  [96 %-99 %] 99 % (02/25 0040)  Physical Exam:  General: alert, cooperative, and no distress Lochia: appropriate Uterine Fundus: firm Incision: Honeycomb dressing in place with stable strikethrough DVT Evaluation: No evidence of DVT seen on physical exam.  Recent Labs    01/25/24 0436  HGB 10.3*  HCT 31.5*    Assessment/Plan: Hannah Coffey is a 38 y.o. G2 now P2002 POD1 Status post Cesarean section @ [redacted]w[redacted]d for h/o prior C-sectionDoing well postoperatively.  A2GDM: Normal CBGs postpartum. Plan 6w pp 2hr GTT Anxiety: stable on lexapro 10mg . Plan postpartum mood check. Neonatal circumcision: Desires neonatal circumcision, R/B/A of procedure discussed at length. Pt understands that neonatal circumcision is not considered medically necessary and is elective. The risks include, but are not limited to bleeding, infection, damage to the penis, development of scar tissue, and having to have it redone at a later date. Pt understands theses risks and wishes to proceed.  Dispo:Anticipate discharge on POD2-3.  Willa Frater, MD 01/25/2024, 9:46 AM

## 2024-01-25 NOTE — Anesthesia Postprocedure Evaluation (Signed)
 Anesthesia Post Note  Patient: Hannah Coffey  Procedure(s) Performed: CESAREAN SECTION WITH BILATERAL TUBAL LIGATION BILATERAL TUBAL LIGATION (Bilateral)     Anesthesia Type: Spinal Anesthetic complications: no   No notable events documented.  Last Vitals:  Vitals:   01/24/24 2025 01/25/24 0040  BP: (!) 94/55 (!) 99/58  Pulse:    Resp: 18 18  Temp: 37 C 37 C  SpO2: 98% 99%    Last Pain:  Vitals:   01/25/24 0730  TempSrc:   PainSc: 4                  Yoana Staib P Jehan Bonano

## 2024-01-25 NOTE — Progress Notes (Signed)
 MOB was referred for history of anxiety.  * Referral screened out by Clinical Social Worker because none of the following criteria appear to apply:  ~ History of anxiety during this pregnancy, or of post-partum depression following prior delivery.  ~ Diagnosis of anxiety within last 3 years  OR  * MOB's symptoms currently being treated with medication and/or therapy.  Per OB notes, MOB is prescribed Lexapro 20mg  for support.   Please contact the Clinical Social Worker if needs arise, by [facility] request, or if MOB scores greater than 9/yes to question 10 on Edinburgh Postpartum Depression Screen.  Enos Fling, Theresia Majors Clinical Social Worker [phone]

## 2024-01-25 NOTE — Addendum Note (Signed)
 Addendum  created 01/25/24 1023 by Atilano Median, DO   Clinical Note Signed

## 2024-01-26 LAB — SURGICAL PATHOLOGY

## 2024-01-26 MED ORDER — OXYCODONE HCL 5 MG PO TABS: 5.0000 mg | ORAL_TABLET | Freq: Four times a day (QID) | ORAL | 0 refills | Status: AC | PRN

## 2024-01-26 MED ORDER — IBUPROFEN 800 MG PO TABS: 800.0000 mg | ORAL_TABLET | Freq: Three times a day (TID) | ORAL | 1 refills | Status: AC | PRN

## 2024-01-26 NOTE — Discharge Summary (Signed)
 Postpartum Discharge Summary  Date of Service updated     Patient Name: Hannah Coffey DOB: 1986/08/23 MRN: [id number]  Date of admission: 01/24/2024 Delivery date:01/24/2024 Delivering provider: Derl Barrow E Date of discharge: 01/26/2024  Admitting diagnosis: History of cesarean delivery [Z98.891] Intrauterine pregnancy: [redacted]w[redacted]d     Secondary diagnosis:  Principal Problem:   History of cesarean delivery  Additional problems: none    Discharge diagnosis: Term Pregnancy Delivered                                              Post partum procedures: none Augmentation: N/A Complications: None  Hospital course: Sceduled C/S   38 y.o. yo G2P2002 at [redacted]w[redacted]d was admitted to the hospital 01/24/2024 for scheduled cesarean section with the following indication:Elective Repeat.Delivery details are as follows:  Membrane Rupture Time/Date: 8:00 AM,01/24/2024  Delivery Method:C-Section, Low Transverse Operative Delivery:N/A Details of operation can be found in separate operative note.  Patient had a postpartum course complicated by none.  She is ambulating, tolerating a regular diet, passing flatus, and urinating well. Patient is discharged home in stable condition on  01/26/24        Newborn Data: Birth date:01/24/2024 Birth time:8:01 AM Gender:Female Living status:Living Apgars:9 ,9  Weight:3330 g    Immunizations administered: Immunization History  Administered Date(s) Administered   Moderna Covid-19 Fall Seasonal Vaccine 33yrs & older 10/13/2023   PFIZER Comirnaty(Gray Top)Covid-19 Tri-Sucrose Vaccine 02/14/2020, 03/06/2020, 12/01/2020   Pfizer Covid-19 Vaccine Bivalent Booster 29yrs & up 10/25/2021    Physical exam  Vitals:   01/25/24 0040 01/25/24 1806 01/25/24 2100 01/26/24 0530  BP: (!) 99/58 107/67 105/62 115/74  Pulse:   80 81  Resp: 18 18 18 17   Temp: 98.6 F (37 C) 98 F (36.7 C) 97.7 F (36.5 C) 98.2 F (36.8 C)  TempSrc: Oral  Oral Oral  SpO2: 99%  100% 99%   Weight:      Height:       General: alert, cooperative, and no distress Lochia: appropriate Uterine Fundus: firm Incision: Healing well with no significant drainage, No significant erythema, Dressing is clean, dry, and intact DVT Evaluation: No evidence of DVT seen on physical exam. Negative Homan's sign. No cords or calf tenderness. Labs: Lab Results  Component Value Date   WBC 11.6 (H) 01/25/2024   HGB 10.3 (L) 01/25/2024   HCT 31.5 (L) 01/25/2024   MCV 92.4 01/25/2024   PLT 137 (L) 01/25/2024       No data to display         Edinburgh Score:    01/24/2024   10:00 AM  Edinburgh Postnatal Depression Scale Screening Tool  I have been able to laugh and see the funny side of things. 0  I have looked forward with enjoyment to things. 0  I have blamed myself unnecessarily when things went wrong. 0  I have been anxious or worried for no good reason. 1  I have felt scared or panicky for no good reason. 0  Things have been getting on top of me. 0  I have been so unhappy that I have had difficulty sleeping. 0  I have felt sad or miserable. 1  I have been so unhappy that I have been crying. 0  The thought of harming myself has occurred to me. 0  Edinburgh Postnatal Depression Scale Total  2      After visit meds:  Allergies as of 01/26/2024   No Known Allergies      Medication List     STOP taking these medications    metFORMIN 500 MG tablet Commonly known as: GLUCOPHAGE       TAKE these medications    escitalopram 10 MG tablet Commonly known as: LEXAPRO Take 10 mg by mouth daily.   ibuprofen 800 MG tablet Commonly known as: ADVIL Take 1 tablet (800 mg total) by mouth every 8 (eight) hours as needed.   multivitamin-prenatal 27-0.8 MG Tabs tablet Take 1 tablet by mouth daily.   oxyCODONE 5 MG immediate release tablet Commonly known as: Oxy IR/ROXICODONE Take 1 tablet (5 mg total) by mouth every 6 (six) hours as needed for severe pain (pain score  7-10).         Discharge home in stable condition Infant Feeding: Breast Infant Disposition:home with mother Discharge instruction: per After Visit Summary and Postpartum booklet. Activity: Advance as tolerated. Pelvic rest for 6 week  Diet: routine diet Anticipated Birth Control: BTL done PP Postpartum Appointment:6 weeks Additional Postpartum F/U: Incision check 1 week Follow up Visit: [facility]    01/26/2024 Carlisle Cater, MD

## 2024-01-26 NOTE — Progress Notes (Signed)
 Subjective: Postpartum/Post-op Day 2: Cesarean Delivery Patient reports pain is well controlled. Ambulating with no problems. tolerating PO, + flatus, and no problems voiding.  Normal lochia  Objective: Vital signs in last 24 hours: Temp:  [97.7 F (36.5 C)-98.2 F (36.8 C)] 98.2 F (36.8 C) (02/26 0530) Pulse Rate:  [80-81] 81 (02/26 0530) Resp:  [17-18] 17 (02/26 0530) BP: (105-115)/(62-74) 115/74 (02/26 0530) SpO2:  [99 %-100 %] 99 % (02/26 0530)  Physical Exam:  General: alert, cooperative, and no distress Lochia: appropriate Uterine Fundus: firm Incision: Honeycomb dressing in place with stable strikethrough DVT Evaluation: No evidence of DVT seen on physical exam.  Recent Labs    01/25/24 0436  HGB 10.3*  HCT 31.5*    Assessment/Plan: Hannah Coffey is a 38 y.o. G2 now P2002 POD2 Status post Cesarean section + BS @ [redacted]w[redacted]d for h/o prior C-sectionDoing well postoperatively.  A2GDM: Normal CBGs postpartum. Plan 6w pp 2hr GTT Anxiety: stable on lexapro 10mg . Plan postpartum mood check. Baby s/p circ Gestational TCP - incidental postop plt 137K =- recheck CBC at 6wk pp, lochia appropriate at this time Dispo:Anticipate discharge today  Carlisle Cater, MD 01/26/2024, 9:24 AM

## 2024-02-02 ENCOUNTER — Telehealth (HOSPITAL_COMMUNITY): Payer: Self-pay | Admitting: *Deleted

## 2024-02-02 NOTE — Telephone Encounter (Signed)
 02/02/2024  Name: Hannah Coffey MRN: [id number] DOB: [birth year]  Reason for Call:  Transition of Care Hospital Discharge Call  Contact Status: Patient Contact Status: Unable to contact ("call cannot be completed")  Language assistant needed:          Follow-Up Questions:    Inocente Salles Postnatal Depression Scale:  In the Past 7 Days:    PHQ2-9 Depression Scale:     Discharge Follow-up:    Post-discharge interventions: NA  Salena Saner, RN 02/02/2024 14:40

## 2024-12-13 ENCOUNTER — Ambulatory Visit: Payer: Self-pay | Admitting: Podiatry

## 2024-12-13 ENCOUNTER — Encounter: Payer: Self-pay | Admitting: Podiatry

## 2024-12-13 DIAGNOSIS — L600 Ingrowing nail: Secondary | ICD-10-CM

## 2024-12-13 NOTE — Progress Notes (Signed)
"  °  Subjective:  Patient ID: Hannah Coffey, female    DOB: 1986/04/05,   MRN: [id number]  Chief Complaint  Patient presents with   Ingrown Toenail    B/L INGROWN GREAT TOE NAIL    39 y.o. female presents for concern of bilateral great toe ingrown toenails.  She relates this has been ongoing for quite some time.  She has tried trimming them back and they continue to come back she is hoping to have them permanently removed today. Denies any other pedal complaints. Denies n/v/f/c.   Past Medical History:  Diagnosis Date   Anxiety    Gestational diabetes    PCOS (polycystic ovarian syndrome)     Objective:  Physical Exam: Vascular: DP/PT pulses 2/4 bilateral. CFT <3 seconds. Normal hair growth on digits. No edema.  Skin. No lacerations or abrasions bilateral feet.  Incurvation noted to bilateral borders of bilateral hallux nails.  Tenderness to palpation noted.  Minimal erythema edema or purulence noted. Musculoskeletal: MMT 5/5 bilateral lower extremities in DF, PF, Inversion and Eversion. Deceased ROM in DF of ankle joint.  Neurological: Sensation intact to light touch.   Assessment:   1. Ingrown right greater toenail   2. Ingrown left greater toenail      Plan:  Patient was evaluated and treated and all questions answered. Discussed ingrown toenails etiology and treatment options including procedure for removal vs conservative care.  Patient requesting removal of ingrown nail today. Procedure below.  Discussed procedure and post procedure care and patient expressed understanding.  Will follow-up in 2 weeks for nail check or sooner if any problems arise.    Procedure:  Procedure: partial Nail Avulsion of bilaterally hallux bilateral nail border.  Surgeon: Asberry Failing, DPM  Pre-op Dx: Ingrown toenail without infection Post-op: Same  Place of Surgery: Office exam room.  Indications for surgery: Painful and ingrown toenail.    The patient is requesting removal of nail  with  chemical matrixectomy. Risks and complications were discussed with the patient for which they understand and written consent was obtained. Under sterile conditions a total of 3 mL of  1% lidocaine  plain was infiltrated in a hallux block fashion. Once anesthetized, the skin was prepped in sterile fashion. A tourniquet was then applied. Next the bilateral aspect of hallux nail border was then sharply excised making sure to remove the entire offending nail border.  Next phenol was then applied under standard conditions to permanently destroy the matrix and copiously irrigated. Silvadene was applied. A dry sterile dressing was applied. After application of the dressing the tourniquet was removed and there is found to be an immediate capillary refill time to the digit. The patient tolerated the procedure well without any complications. Post procedure instructions were discussed the patient for which he verbally understood.  Discussed signs/symptoms of infection and directed to call the office immediately should any occur or go directly to the emergency room. In the meantime, encouraged to call the office with any questions, concerns, changes symptoms.   Asberry Failing, DPM    "

## 2024-12-13 NOTE — Patient Instructions (Signed)
